# Patient Record
Sex: Male | Born: 1935 | Race: White | Hispanic: No | Marital: Married | Smoking: Never smoker
Health system: Southern US, Community
[De-identification: ages and names within clinical notes are randomized; demographics above are authoritative.]

## PROBLEM LIST (undated history)

## (undated) DIAGNOSIS — K4090 Unilateral inguinal hernia, without obstruction or gangrene, not specified as recurrent: Secondary | ICD-10-CM

## (undated) DIAGNOSIS — E785 Hyperlipidemia, unspecified: Secondary | ICD-10-CM

## (undated) DIAGNOSIS — E119 Type 2 diabetes mellitus without complications: Secondary | ICD-10-CM

## (undated) DIAGNOSIS — M1990 Unspecified osteoarthritis, unspecified site: Secondary | ICD-10-CM

## (undated) DIAGNOSIS — Z9889 Other specified postprocedural states: Secondary | ICD-10-CM

## (undated) DIAGNOSIS — M109 Gout, unspecified: Secondary | ICD-10-CM

## (undated) DIAGNOSIS — Z8613 Personal history of malaria: Secondary | ICD-10-CM

## (undated) DIAGNOSIS — N281 Cyst of kidney, acquired: Secondary | ICD-10-CM

## (undated) DIAGNOSIS — K5790 Diverticulosis of intestine, part unspecified, without perforation or abscess without bleeding: Secondary | ICD-10-CM

## (undated) DIAGNOSIS — I10 Essential (primary) hypertension: Secondary | ICD-10-CM

## (undated) DIAGNOSIS — N400 Enlarged prostate without lower urinary tract symptoms: Secondary | ICD-10-CM

## (undated) DIAGNOSIS — J342 Deviated nasal septum: Secondary | ICD-10-CM

## (undated) DIAGNOSIS — R112 Nausea with vomiting, unspecified: Secondary | ICD-10-CM

## (undated) DIAGNOSIS — N200 Calculus of kidney: Secondary | ICD-10-CM

## (undated) DIAGNOSIS — C801 Malignant (primary) neoplasm, unspecified: Secondary | ICD-10-CM

## (undated) HISTORY — PX: EYE SURGERY: SHX253

## (undated) HISTORY — PX: MOHS SURGERY: SUR867

## (undated) HISTORY — PX: TONSILLECTOMY: SUR1361

## (undated) HISTORY — PX: ELBOW SURGERY: SHX618

## (undated) HISTORY — PX: APPENDECTOMY: SHX54

## (undated) HISTORY — PX: COLONOSCOPY: SHX174

## (undated) HISTORY — PX: HERNIA REPAIR: SHX51

## (undated) HISTORY — PX: CARPAL TUNNEL RELEASE: SHX101

---

## 1986-08-11 HISTORY — PX: TRANSURETHRAL RESECTION OF PROSTATE: SHX73

## 1998-01-23 ENCOUNTER — Other Ambulatory Visit: Admission: RE | Admit: 1998-01-23 | Discharge: 1998-01-23 | Payer: Self-pay | Admitting: *Deleted

## 2000-06-02 ENCOUNTER — Encounter: Admission: RE | Admit: 2000-06-02 | Discharge: 2000-06-02 | Payer: Self-pay | Admitting: *Deleted

## 2000-06-02 ENCOUNTER — Encounter: Payer: Self-pay | Admitting: *Deleted

## 2004-08-11 HISTORY — PX: RETINAL TEAR REPAIR CRYOTHERAPY: SHX5304

## 2004-09-23 ENCOUNTER — Encounter: Admission: RE | Admit: 2004-09-23 | Discharge: 2004-09-23 | Payer: Self-pay | Admitting: Otolaryngology

## 2004-10-10 ENCOUNTER — Ambulatory Visit (HOSPITAL_COMMUNITY): Admission: RE | Admit: 2004-10-10 | Discharge: 2004-10-10 | Payer: Self-pay | Admitting: Gastroenterology

## 2011-04-15 ENCOUNTER — Ambulatory Visit (HOSPITAL_BASED_OUTPATIENT_CLINIC_OR_DEPARTMENT_OTHER)
Admission: RE | Admit: 2011-04-15 | Discharge: 2011-04-15 | Disposition: A | Discharge status: Home / Self Care | Payer: Medicare Other | Source: Ambulatory Visit | Attending: Orthopedic Surgery | Admitting: Orthopedic Surgery

## 2011-04-15 DIAGNOSIS — H409 Unspecified glaucoma: Secondary | ICD-10-CM | POA: Insufficient documentation

## 2011-04-15 DIAGNOSIS — I10 Essential (primary) hypertension: Secondary | ICD-10-CM | POA: Insufficient documentation

## 2011-04-15 DIAGNOSIS — G5620 Lesion of ulnar nerve, unspecified upper limb: Secondary | ICD-10-CM | POA: Insufficient documentation

## 2011-04-15 DIAGNOSIS — G5600 Carpal tunnel syndrome, unspecified upper limb: Secondary | ICD-10-CM | POA: Insufficient documentation

## 2011-04-15 DIAGNOSIS — Z01810 Encounter for preprocedural cardiovascular examination: Secondary | ICD-10-CM | POA: Insufficient documentation

## 2011-04-15 DIAGNOSIS — M129 Arthropathy, unspecified: Secondary | ICD-10-CM | POA: Insufficient documentation

## 2011-04-15 DIAGNOSIS — E7800 Pure hypercholesterolemia, unspecified: Secondary | ICD-10-CM | POA: Insufficient documentation

## 2011-04-15 LAB — POCT I-STAT, CHEM 8
BUN: 18 mg/dL (ref 6–23)
Calcium, Ion: 1.13 mmol/L (ref 1.12–1.32)
Chloride: 102 mEq/L (ref 96–112)
Creatinine, Ser: 0.9 mg/dL (ref 0.50–1.35)
Glucose, Bld: 119 mg/dL — ABNORMAL HIGH (ref 70–99)
HCT: 43 % (ref 39.0–52.0)
Hemoglobin: 14.6 g/dL (ref 13.0–17.0)
Potassium: 3.1 mEq/L — ABNORMAL LOW (ref 3.5–5.1)
Sodium: 143 mEq/L (ref 135–145)
TCO2: 28 mmol/L (ref 0–100)

## 2011-04-24 NOTE — Op Note (Signed)
Erik Price, [name]:  [id number]  MEDICAL RECORD NO.:  [id number]  LOCATION:                                 FACILITY:  PHYSICIAN:  Katy Fitch. Ahley Bulls, M.D.      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  OPERATION: 1. Anterior subcutaneous transposition of right ulnar nerve. 2. Right carpal tunnel release through separate incision.  OPERATING SURGEON:  Katy Fitch. Kayshawn Ozburn, MD  ASSISTANT:  Annye Rusk, PA-C  ANESTHESIA:  General by LMA.  SUPERVISING ANESTHESIOLOGIST:  Achille Rich, MD  INDICATIONS:  Erik Price is a 77 year old gentleman referred through the courtesy of Dr. Rodrigo Ran for evaluation and management of intrinsic atrophy and weakness of the right hand.  Clinical examination revealed a 77 year old gentleman with excellent state of health with a chronic elbow deformity due to ununited medial condylar fracture sustained at age 77.  In his youth he was treated with closed technique without what is considered standard treatment at this time which would be open reduction and internal fixation of the medial epicondyle.  He had a chronic medial elbow deformity placing the nerve at significant traction with positions of elbow flexion.  He did not have a  progressive valgus deformity elbow.  During the past 2 years, he noted progressive weakness of __________ grasp.  He had loss of sensibility in the ring and small fingers.  Dr. Waynard Edwards noted these issues on clinical examination and referred him for upper extremity orthopedic consult.  On exam, he was noted to have profound intrinsic atrophy and findings compatible with McGowan grade 3 ulnar neuropathy.  He was evaluated by Dr. Nada Boozer with detailed electrodiagnostic studies which revealed bilateral carpal tunnel syndrome of a mild to moderate degree and profound left ulnar neuropathy with conduction speed across the right elbow of 30 meters per second.  We had a detailed informed consent during which we advised to proceed with transposition of the ulnar nerve at the elbow like the subcutaneous and a right carpal tunnel release.  He understands that he will need to wait a minimum of 18 months to see the maximum benefit of his transposition.  He should see benefit from the carpal tunnel release within 4 months or sooner.  Preoperatively, questions were invited and answered in detail.  He was interviewed by Dr. Chaney Malling from Anesthesia.  General anesthesia by LMA technique was recommended and accepted.  PROCEDURE:  Erik Price was brought to room 2 of the [facility] and placed in supine position on the operating table. Under Dr. Seward Meth direct supervision, general anesthesia by LMA technique was induced.  The right arm was prepped with Betadine soap and solution and sterilely draped.  A pneumatic tourniquet was applied to the proximal right brachium.  Following routine surgical time-out, we exsanguinated the right arm with an Esmarch bandage and inflated the arterial tourniquet to 220 mmHg. Incisions were planned in the palm and at the medial elbow for the aforementioned procedures.  Attention then directed to the hand where a short incision was fashion in  line of ring finger in the palm.  Subcutaneous tissues were carefully divided revealing the palmar fascia.  This was split in line of its fibers to reveal the common sensory branch of the median nerve and superficial palmar arch.  The common sensory branches were followed to the median nerve proper which was separated from transverse carpal ligament with a Insurance risk surveyor.  After hemostasis was achieved with bipolar forceps, the transverse carpal ligament was released subcutaneously extending into the distal forearm.  This widely opened the carpal canal.  No masses or other predicaments were noted anatomically.  The wound was then repaired with intradermal 3-0 Prolene suture.  Attention was then directed to the medial aspect of the right elbow.  A lengthy 10-cm incision was fashioned due to the marked bony deformity. There was a very substantial triceps muscle with hypertrophy and some encapsulation of the ulnar nerve proximally.  The ulnar nerve was identified at the proximal level of the medial epicondyle and fracture nonunion and was followed distally.  The nerve was noted to be markedly flattened and ischemic at the distal aspect of the epicondylar fragment. We subsequently dissected the ulnar nerve 4 cm into the heads of the flexor carpi ulnaris and meticulously performed in anterior subcutaneous transverse position preserving the articular branch and sacrificing one small branch to the posterior aspect of the epicondyle.  The nerve was transposed to a highly vascular bed of subcutaneous fat. Care was taken to avoid any fracture or bands that would cause secondary entrapment.  The medial brachial intermuscular septum was identified and resected with scissors and cutting cautery.  Once the nerve was placed in its new position, we created a fascial wall by sewing the deep fascia to the periosteum and fascia of the flexor carpi ulnaris.  This created a very satisfactory pathway  for the nerve that was not impeding with full extension and flexion to 140 degrees of elbow movement.  The tourniquet was released.  The bleeding points were electrocauterized with bipolar current under saline followed by repair of the skin with subcutaneous sutures of 3-0 Vicryl and intradermal 3-0 Prolene segmental suture with Steri-Strips.  For aftercare, Erik Price was dressed with a gauze dressing and Tegaderm at the elbow and a standard carpal tunnel dressing with sterile gauze, sterile Webril and a volar plaster splint maintaining the wrist in 10 degrees of dorsiflexion.  For aftercare, he is provided prescriptions for Percocet 5 mg one p.o. q.4-6 h. p.r.n. pain.  He was provided 1 g of Ancef as an IV prophylactic antibiotic.     Katy Fitch Brileigh Sevcik, M.D.     RVS/MEDQ  D:  04/15/2011  T:  04/15/2011  Job:  [id number]  cc:   Loraine Leriche A. Perini, M.D.  Electronically Signed by Erik Price M.D. on 04/24/2011 08:40:50 AM

## 2011-09-04 DIAGNOSIS — L570 Actinic keratosis: Secondary | ICD-10-CM | POA: Diagnosis not present

## 2011-09-04 DIAGNOSIS — Z85828 Personal history of other malignant neoplasm of skin: Secondary | ICD-10-CM | POA: Diagnosis not present

## 2011-09-04 DIAGNOSIS — D485 Neoplasm of uncertain behavior of skin: Secondary | ICD-10-CM | POA: Diagnosis not present

## 2011-09-25 DIAGNOSIS — L821 Other seborrheic keratosis: Secondary | ICD-10-CM | POA: Diagnosis not present

## 2011-09-25 DIAGNOSIS — D235 Other benign neoplasm of skin of trunk: Secondary | ICD-10-CM | POA: Diagnosis not present

## 2011-09-25 DIAGNOSIS — D485 Neoplasm of uncertain behavior of skin: Secondary | ICD-10-CM | POA: Diagnosis not present

## 2011-09-25 DIAGNOSIS — C44519 Basal cell carcinoma of skin of other part of trunk: Secondary | ICD-10-CM | POA: Diagnosis not present

## 2012-01-28 DIAGNOSIS — R7301 Impaired fasting glucose: Secondary | ICD-10-CM | POA: Diagnosis not present

## 2012-01-28 DIAGNOSIS — R809 Proteinuria, unspecified: Secondary | ICD-10-CM | POA: Diagnosis not present

## 2012-01-28 DIAGNOSIS — E785 Hyperlipidemia, unspecified: Secondary | ICD-10-CM | POA: Diagnosis not present

## 2012-01-28 DIAGNOSIS — H35349 Macular cyst, hole, or pseudohole, unspecified eye: Secondary | ICD-10-CM | POA: Diagnosis not present

## 2012-01-28 DIAGNOSIS — H2510 Age-related nuclear cataract, unspecified eye: Secondary | ICD-10-CM | POA: Diagnosis not present

## 2012-01-28 DIAGNOSIS — H5210 Myopia, unspecified eye: Secondary | ICD-10-CM | POA: Diagnosis not present

## 2012-01-28 DIAGNOSIS — I10 Essential (primary) hypertension: Secondary | ICD-10-CM | POA: Diagnosis not present

## 2012-01-28 DIAGNOSIS — Z125 Encounter for screening for malignant neoplasm of prostate: Secondary | ICD-10-CM | POA: Diagnosis not present

## 2012-01-28 DIAGNOSIS — M109 Gout, unspecified: Secondary | ICD-10-CM | POA: Diagnosis not present

## 2012-02-02 DIAGNOSIS — Z125 Encounter for screening for malignant neoplasm of prostate: Secondary | ICD-10-CM | POA: Diagnosis not present

## 2012-02-02 DIAGNOSIS — E119 Type 2 diabetes mellitus without complications: Secondary | ICD-10-CM | POA: Diagnosis not present

## 2012-02-02 DIAGNOSIS — Z79899 Other long term (current) drug therapy: Secondary | ICD-10-CM | POA: Diagnosis not present

## 2012-02-02 DIAGNOSIS — Z1212 Encounter for screening for malignant neoplasm of rectum: Secondary | ICD-10-CM | POA: Diagnosis not present

## 2012-02-02 DIAGNOSIS — Z0000 Encounter for general adult medical examination without abnormal findings: Secondary | ICD-10-CM | POA: Diagnosis not present

## 2012-02-02 DIAGNOSIS — E785 Hyperlipidemia, unspecified: Secondary | ICD-10-CM | POA: Diagnosis not present

## 2012-04-01 DIAGNOSIS — IMO0002 Reserved for concepts with insufficient information to code with codable children: Secondary | ICD-10-CM | POA: Diagnosis not present

## 2012-04-01 DIAGNOSIS — H2510 Age-related nuclear cataract, unspecified eye: Secondary | ICD-10-CM | POA: Diagnosis not present

## 2012-04-08 DIAGNOSIS — H2510 Age-related nuclear cataract, unspecified eye: Secondary | ICD-10-CM | POA: Diagnosis not present

## 2012-04-08 DIAGNOSIS — IMO0002 Reserved for concepts with insufficient information to code with codable children: Secondary | ICD-10-CM | POA: Diagnosis not present

## 2012-04-29 DIAGNOSIS — Z23 Encounter for immunization: Secondary | ICD-10-CM | POA: Diagnosis not present

## 2012-07-26 DIAGNOSIS — R7301 Impaired fasting glucose: Secondary | ICD-10-CM | POA: Diagnosis not present

## 2012-07-26 DIAGNOSIS — E785 Hyperlipidemia, unspecified: Secondary | ICD-10-CM | POA: Diagnosis not present

## 2012-09-29 DIAGNOSIS — D2370 Other benign neoplasm of skin of unspecified lower limb, including hip: Secondary | ICD-10-CM | POA: Diagnosis not present

## 2012-09-29 DIAGNOSIS — Z85828 Personal history of other malignant neoplasm of skin: Secondary | ICD-10-CM | POA: Diagnosis not present

## 2012-09-29 DIAGNOSIS — D485 Neoplasm of uncertain behavior of skin: Secondary | ICD-10-CM | POA: Diagnosis not present

## 2012-09-29 DIAGNOSIS — L570 Actinic keratosis: Secondary | ICD-10-CM | POA: Diagnosis not present

## 2012-09-29 DIAGNOSIS — L821 Other seborrheic keratosis: Secondary | ICD-10-CM | POA: Diagnosis not present

## 2012-09-29 DIAGNOSIS — L820 Inflamed seborrheic keratosis: Secondary | ICD-10-CM | POA: Diagnosis not present

## 2013-03-23 DIAGNOSIS — M109 Gout, unspecified: Secondary | ICD-10-CM | POA: Diagnosis not present

## 2013-03-23 DIAGNOSIS — E1169 Type 2 diabetes mellitus with other specified complication: Secondary | ICD-10-CM | POA: Diagnosis not present

## 2013-03-23 DIAGNOSIS — Z125 Encounter for screening for malignant neoplasm of prostate: Secondary | ICD-10-CM | POA: Diagnosis not present

## 2013-03-23 DIAGNOSIS — E785 Hyperlipidemia, unspecified: Secondary | ICD-10-CM | POA: Diagnosis not present

## 2013-03-31 DIAGNOSIS — E119 Type 2 diabetes mellitus without complications: Secondary | ICD-10-CM | POA: Diagnosis not present

## 2013-03-31 DIAGNOSIS — Z79899 Other long term (current) drug therapy: Secondary | ICD-10-CM | POA: Diagnosis not present

## 2013-03-31 DIAGNOSIS — M109 Gout, unspecified: Secondary | ICD-10-CM | POA: Diagnosis not present

## 2013-03-31 DIAGNOSIS — Z23 Encounter for immunization: Secondary | ICD-10-CM | POA: Diagnosis not present

## 2013-03-31 DIAGNOSIS — Z6827 Body mass index (BMI) 27.0-27.9, adult: Secondary | ICD-10-CM | POA: Diagnosis not present

## 2013-03-31 DIAGNOSIS — Z87898 Personal history of other specified conditions: Secondary | ICD-10-CM | POA: Diagnosis not present

## 2013-03-31 DIAGNOSIS — Z1212 Encounter for screening for malignant neoplasm of rectum: Secondary | ICD-10-CM | POA: Diagnosis not present

## 2013-03-31 DIAGNOSIS — Z125 Encounter for screening for malignant neoplasm of prostate: Secondary | ICD-10-CM | POA: Diagnosis not present

## 2013-03-31 DIAGNOSIS — Z87448 Personal history of other diseases of urinary system: Secondary | ICD-10-CM | POA: Diagnosis not present

## 2013-03-31 DIAGNOSIS — Z1331 Encounter for screening for depression: Secondary | ICD-10-CM | POA: Diagnosis not present

## 2013-03-31 DIAGNOSIS — Z0000 Encounter for general adult medical examination without abnormal findings: Secondary | ICD-10-CM | POA: Diagnosis not present

## 2013-04-20 DIAGNOSIS — Z23 Encounter for immunization: Secondary | ICD-10-CM | POA: Diagnosis not present

## 2013-05-27 DIAGNOSIS — Z961 Presence of intraocular lens: Secondary | ICD-10-CM | POA: Diagnosis not present

## 2013-05-27 DIAGNOSIS — H52209 Unspecified astigmatism, unspecified eye: Secondary | ICD-10-CM | POA: Diagnosis not present

## 2013-08-18 DIAGNOSIS — H905 Unspecified sensorineural hearing loss: Secondary | ICD-10-CM | POA: Diagnosis not present

## 2013-08-18 DIAGNOSIS — H6120 Impacted cerumen, unspecified ear: Secondary | ICD-10-CM | POA: Diagnosis not present

## 2013-08-24 DIAGNOSIS — H905 Unspecified sensorineural hearing loss: Secondary | ICD-10-CM | POA: Diagnosis not present

## 2013-08-31 DIAGNOSIS — Z85828 Personal history of other malignant neoplasm of skin: Secondary | ICD-10-CM | POA: Diagnosis not present

## 2013-08-31 DIAGNOSIS — L570 Actinic keratosis: Secondary | ICD-10-CM | POA: Diagnosis not present

## 2013-08-31 DIAGNOSIS — L821 Other seborrheic keratosis: Secondary | ICD-10-CM | POA: Diagnosis not present

## 2013-08-31 DIAGNOSIS — C44221 Squamous cell carcinoma of skin of unspecified ear and external auricular canal: Secondary | ICD-10-CM | POA: Diagnosis not present

## 2013-08-31 DIAGNOSIS — D485 Neoplasm of uncertain behavior of skin: Secondary | ICD-10-CM | POA: Diagnosis not present

## 2013-09-12 DIAGNOSIS — Z85828 Personal history of other malignant neoplasm of skin: Secondary | ICD-10-CM | POA: Diagnosis not present

## 2013-09-12 DIAGNOSIS — C44221 Squamous cell carcinoma of skin of unspecified ear and external auricular canal: Secondary | ICD-10-CM | POA: Diagnosis not present

## 2013-10-07 DIAGNOSIS — E119 Type 2 diabetes mellitus without complications: Secondary | ICD-10-CM | POA: Diagnosis not present

## 2013-11-15 DIAGNOSIS — R300 Dysuria: Secondary | ICD-10-CM | POA: Diagnosis not present

## 2013-11-15 DIAGNOSIS — R82998 Other abnormal findings in urine: Secondary | ICD-10-CM | POA: Diagnosis not present

## 2013-12-02 DIAGNOSIS — R339 Retention of urine, unspecified: Secondary | ICD-10-CM | POA: Diagnosis not present

## 2013-12-02 DIAGNOSIS — Q619 Cystic kidney disease, unspecified: Secondary | ICD-10-CM | POA: Diagnosis not present

## 2013-12-02 DIAGNOSIS — N200 Calculus of kidney: Secondary | ICD-10-CM | POA: Diagnosis not present

## 2013-12-02 DIAGNOSIS — R3129 Other microscopic hematuria: Secondary | ICD-10-CM | POA: Diagnosis not present

## 2013-12-21 DIAGNOSIS — R3129 Other microscopic hematuria: Secondary | ICD-10-CM | POA: Diagnosis not present

## 2013-12-21 DIAGNOSIS — Q619 Cystic kidney disease, unspecified: Secondary | ICD-10-CM | POA: Diagnosis not present

## 2014-01-24 DIAGNOSIS — R3129 Other microscopic hematuria: Secondary | ICD-10-CM | POA: Diagnosis not present

## 2014-01-24 DIAGNOSIS — Q619 Cystic kidney disease, unspecified: Secondary | ICD-10-CM | POA: Diagnosis not present

## 2014-03-30 DIAGNOSIS — C44211 Basal cell carcinoma of skin of unspecified ear and external auricular canal: Secondary | ICD-10-CM | POA: Diagnosis not present

## 2014-03-30 DIAGNOSIS — D485 Neoplasm of uncertain behavior of skin: Secondary | ICD-10-CM | POA: Diagnosis not present

## 2014-03-30 DIAGNOSIS — Z85828 Personal history of other malignant neoplasm of skin: Secondary | ICD-10-CM | POA: Diagnosis not present

## 2014-03-30 DIAGNOSIS — L821 Other seborrheic keratosis: Secondary | ICD-10-CM | POA: Diagnosis not present

## 2014-03-30 DIAGNOSIS — D1801 Hemangioma of skin and subcutaneous tissue: Secondary | ICD-10-CM | POA: Diagnosis not present

## 2014-03-30 DIAGNOSIS — D239 Other benign neoplasm of skin, unspecified: Secondary | ICD-10-CM | POA: Diagnosis not present

## 2014-04-18 DIAGNOSIS — I10 Essential (primary) hypertension: Secondary | ICD-10-CM | POA: Diagnosis not present

## 2014-04-18 DIAGNOSIS — E119 Type 2 diabetes mellitus without complications: Secondary | ICD-10-CM | POA: Diagnosis not present

## 2014-04-18 DIAGNOSIS — M109 Gout, unspecified: Secondary | ICD-10-CM | POA: Diagnosis not present

## 2014-04-18 DIAGNOSIS — R82998 Other abnormal findings in urine: Secondary | ICD-10-CM | POA: Diagnosis not present

## 2014-04-18 DIAGNOSIS — E785 Hyperlipidemia, unspecified: Secondary | ICD-10-CM | POA: Diagnosis not present

## 2014-04-18 DIAGNOSIS — Z125 Encounter for screening for malignant neoplasm of prostate: Secondary | ICD-10-CM | POA: Diagnosis not present

## 2014-04-28 DIAGNOSIS — Z0000 Encounter for general adult medical examination without abnormal findings: Secondary | ICD-10-CM | POA: Diagnosis not present

## 2014-04-28 DIAGNOSIS — Z1331 Encounter for screening for depression: Secondary | ICD-10-CM | POA: Diagnosis not present

## 2014-04-28 DIAGNOSIS — Z6826 Body mass index (BMI) 26.0-26.9, adult: Secondary | ICD-10-CM | POA: Diagnosis not present

## 2014-04-28 DIAGNOSIS — M109 Gout, unspecified: Secondary | ICD-10-CM | POA: Diagnosis not present

## 2014-04-28 DIAGNOSIS — Z23 Encounter for immunization: Secondary | ICD-10-CM | POA: Diagnosis not present

## 2014-04-28 DIAGNOSIS — E119 Type 2 diabetes mellitus without complications: Secondary | ICD-10-CM | POA: Diagnosis not present

## 2014-04-28 DIAGNOSIS — Z79899 Other long term (current) drug therapy: Secondary | ICD-10-CM | POA: Diagnosis not present

## 2014-04-28 DIAGNOSIS — I10 Essential (primary) hypertension: Secondary | ICD-10-CM | POA: Diagnosis not present

## 2014-04-28 DIAGNOSIS — K5900 Constipation, unspecified: Secondary | ICD-10-CM | POA: Diagnosis not present

## 2014-04-28 DIAGNOSIS — E785 Hyperlipidemia, unspecified: Secondary | ICD-10-CM | POA: Diagnosis not present

## 2014-05-01 DIAGNOSIS — Z1212 Encounter for screening for malignant neoplasm of rectum: Secondary | ICD-10-CM | POA: Diagnosis not present

## 2014-06-02 DIAGNOSIS — I10 Essential (primary) hypertension: Secondary | ICD-10-CM | POA: Diagnosis not present

## 2014-07-12 DIAGNOSIS — H52203 Unspecified astigmatism, bilateral: Secondary | ICD-10-CM | POA: Diagnosis not present

## 2014-07-12 DIAGNOSIS — Z961 Presence of intraocular lens: Secondary | ICD-10-CM | POA: Diagnosis not present

## 2014-07-14 DIAGNOSIS — R300 Dysuria: Secondary | ICD-10-CM | POA: Diagnosis not present

## 2014-08-07 DIAGNOSIS — R300 Dysuria: Secondary | ICD-10-CM | POA: Diagnosis not present

## 2014-08-07 DIAGNOSIS — R8299 Other abnormal findings in urine: Secondary | ICD-10-CM | POA: Diagnosis not present

## 2014-09-28 DIAGNOSIS — D485 Neoplasm of uncertain behavior of skin: Secondary | ICD-10-CM | POA: Diagnosis not present

## 2014-09-28 DIAGNOSIS — L821 Other seborrheic keratosis: Secondary | ICD-10-CM | POA: Diagnosis not present

## 2014-09-28 DIAGNOSIS — C4441 Basal cell carcinoma of skin of scalp and neck: Secondary | ICD-10-CM | POA: Diagnosis not present

## 2014-09-28 DIAGNOSIS — Z85828 Personal history of other malignant neoplasm of skin: Secondary | ICD-10-CM | POA: Diagnosis not present

## 2014-09-28 DIAGNOSIS — D2371 Other benign neoplasm of skin of right lower limb, including hip: Secondary | ICD-10-CM | POA: Diagnosis not present

## 2014-09-28 DIAGNOSIS — L570 Actinic keratosis: Secondary | ICD-10-CM | POA: Diagnosis not present

## 2014-09-28 DIAGNOSIS — D225 Melanocytic nevi of trunk: Secondary | ICD-10-CM | POA: Diagnosis not present

## 2014-09-28 DIAGNOSIS — D2362 Other benign neoplasm of skin of left upper limb, including shoulder: Secondary | ICD-10-CM | POA: Diagnosis not present

## 2014-09-28 DIAGNOSIS — D1801 Hemangioma of skin and subcutaneous tissue: Secondary | ICD-10-CM | POA: Diagnosis not present

## 2014-10-12 DIAGNOSIS — H9041 Sensorineural hearing loss, unilateral, right ear, with unrestricted hearing on the contralateral side: Secondary | ICD-10-CM | POA: Diagnosis not present

## 2014-10-27 DIAGNOSIS — E119 Type 2 diabetes mellitus without complications: Secondary | ICD-10-CM | POA: Diagnosis not present

## 2014-11-11 ENCOUNTER — Emergency Department (HOSPITAL_COMMUNITY): Payer: Medicare Other

## 2014-11-11 ENCOUNTER — Inpatient Hospital Stay (HOSPITAL_COMMUNITY)
Admission: EM | Admit: 2014-11-11 | Discharge: 2014-11-13 | DRG: 698 | Disposition: A | Discharge status: Home / Self Care | Payer: Medicare Other | Attending: Internal Medicine | Admitting: Internal Medicine

## 2014-11-11 ENCOUNTER — Encounter (HOSPITAL_COMMUNITY): Payer: Self-pay | Admitting: Emergency Medicine

## 2014-11-11 DIAGNOSIS — N401 Enlarged prostate with lower urinary tract symptoms: Secondary | ICD-10-CM | POA: Diagnosis present

## 2014-11-11 DIAGNOSIS — K659 Peritonitis, unspecified: Secondary | ICD-10-CM | POA: Diagnosis present

## 2014-11-11 DIAGNOSIS — N281 Cyst of kidney, acquired: Secondary | ICD-10-CM | POA: Insufficient documentation

## 2014-11-11 DIAGNOSIS — N2889 Other specified disorders of kidney and ureter: Secondary | ICD-10-CM | POA: Diagnosis present

## 2014-11-11 DIAGNOSIS — Q6102 Congenital multiple renal cysts: Secondary | ICD-10-CM

## 2014-11-11 DIAGNOSIS — R1030 Lower abdominal pain, unspecified: Secondary | ICD-10-CM

## 2014-11-11 DIAGNOSIS — E119 Type 2 diabetes mellitus without complications: Secondary | ICD-10-CM

## 2014-11-11 DIAGNOSIS — R972 Elevated prostate specific antigen [PSA]: Secondary | ICD-10-CM | POA: Diagnosis not present

## 2014-11-11 DIAGNOSIS — R100 Acute abdomen: Secondary | ICD-10-CM | POA: Diagnosis not present

## 2014-11-11 DIAGNOSIS — Z9079 Acquired absence of other genital organ(s): Secondary | ICD-10-CM | POA: Diagnosis present

## 2014-11-11 DIAGNOSIS — Z7982 Long term (current) use of aspirin: Secondary | ICD-10-CM

## 2014-11-11 DIAGNOSIS — R109 Unspecified abdominal pain: Secondary | ICD-10-CM | POA: Diagnosis present

## 2014-11-11 DIAGNOSIS — R1031 Right lower quadrant pain: Secondary | ICD-10-CM | POA: Diagnosis not present

## 2014-11-11 DIAGNOSIS — Z79899 Other long term (current) drug therapy: Secondary | ICD-10-CM

## 2014-11-11 DIAGNOSIS — E876 Hypokalemia: Secondary | ICD-10-CM | POA: Diagnosis present

## 2014-11-11 DIAGNOSIS — Z8249 Family history of ischemic heart disease and other diseases of the circulatory system: Secondary | ICD-10-CM

## 2014-11-11 DIAGNOSIS — K567 Ileus, unspecified: Secondary | ICD-10-CM | POA: Diagnosis present

## 2014-11-11 DIAGNOSIS — R944 Abnormal results of kidney function studies: Secondary | ICD-10-CM | POA: Diagnosis not present

## 2014-11-11 DIAGNOSIS — E785 Hyperlipidemia, unspecified: Secondary | ICD-10-CM | POA: Diagnosis present

## 2014-11-11 DIAGNOSIS — N179 Acute kidney failure, unspecified: Secondary | ICD-10-CM | POA: Diagnosis present

## 2014-11-11 DIAGNOSIS — N1330 Unspecified hydronephrosis: Secondary | ICD-10-CM | POA: Diagnosis present

## 2014-11-11 DIAGNOSIS — M109 Gout, unspecified: Secondary | ICD-10-CM | POA: Diagnosis present

## 2014-11-11 DIAGNOSIS — R1032 Left lower quadrant pain: Secondary | ICD-10-CM | POA: Diagnosis not present

## 2014-11-11 DIAGNOSIS — I10 Essential (primary) hypertension: Secondary | ICD-10-CM | POA: Diagnosis not present

## 2014-11-11 DIAGNOSIS — D72829 Elevated white blood cell count, unspecified: Secondary | ICD-10-CM | POA: Diagnosis not present

## 2014-11-11 DIAGNOSIS — Z9049 Acquired absence of other specified parts of digestive tract: Secondary | ICD-10-CM | POA: Diagnosis present

## 2014-11-11 DIAGNOSIS — R112 Nausea with vomiting, unspecified: Secondary | ICD-10-CM | POA: Diagnosis not present

## 2014-11-11 DIAGNOSIS — K8020 Calculus of gallbladder without cholecystitis without obstruction: Secondary | ICD-10-CM | POA: Diagnosis not present

## 2014-11-11 HISTORY — DX: Type 2 diabetes mellitus without complications: E11.9

## 2014-11-11 HISTORY — DX: Diverticulosis of intestine, part unspecified, without perforation or abscess without bleeding: K57.90

## 2014-11-11 HISTORY — DX: Hyperlipidemia, unspecified: E78.5

## 2014-11-11 HISTORY — DX: Cyst of kidney, acquired: N28.1

## 2014-11-11 HISTORY — DX: Essential (primary) hypertension: I10

## 2014-11-11 HISTORY — DX: Gout, unspecified: M10.9

## 2014-11-11 HISTORY — DX: Unilateral inguinal hernia, without obstruction or gangrene, not specified as recurrent: K40.90

## 2014-11-11 HISTORY — DX: Benign prostatic hyperplasia without lower urinary tract symptoms: N40.0

## 2014-11-11 LAB — CBC WITH DIFFERENTIAL/PLATELET
BASOS PCT: 0 % (ref 0–1)
Basophils Absolute: 0 10*3/uL (ref 0.0–0.1)
EOS PCT: 0 % (ref 0–5)
Eosinophils Absolute: 0 10*3/uL (ref 0.0–0.7)
HEMATOCRIT: 42.1 % (ref 39.0–52.0)
Hemoglobin: 14.2 g/dL (ref 13.0–17.0)
Lymphocytes Relative: 4 % — ABNORMAL LOW (ref 12–46)
Lymphs Abs: 0.6 10*3/uL — ABNORMAL LOW (ref 0.7–4.0)
MCH: 29.5 pg (ref 26.0–34.0)
MCHC: 33.7 g/dL (ref 30.0–36.0)
MCV: 87.3 fL (ref 78.0–100.0)
MONO ABS: 1.1 10*3/uL — AB (ref 0.1–1.0)
MONOS PCT: 7 % (ref 3–12)
Neutro Abs: 13.7 10*3/uL — ABNORMAL HIGH (ref 1.7–7.7)
Neutrophils Relative %: 89 % — ABNORMAL HIGH (ref 43–77)
Platelets: 149 10*3/uL — ABNORMAL LOW (ref 150–400)
RBC: 4.82 MIL/uL (ref 4.22–5.81)
RDW: 14.7 % (ref 11.5–15.5)
WBC: 15.5 10*3/uL — ABNORMAL HIGH (ref 4.0–10.5)

## 2014-11-11 LAB — COMPREHENSIVE METABOLIC PANEL
ALT: 19 U/L (ref 0–53)
AST: 25 U/L (ref 0–37)
Albumin: 4.2 g/dL (ref 3.5–5.2)
Alkaline Phosphatase: 56 U/L (ref 39–117)
Anion gap: 14 (ref 5–15)
BUN: 21 mg/dL (ref 6–23)
CHLORIDE: 100 mmol/L (ref 96–112)
CO2: 26 mmol/L (ref 19–32)
CREATININE: 1.32 mg/dL (ref 0.50–1.35)
Calcium: 9.1 mg/dL (ref 8.4–10.5)
GFR calc Af Amer: 56 mL/min — ABNORMAL LOW (ref >90)
GFR calc non Af Amer: 49 mL/min — ABNORMAL LOW (ref >90)
Glucose, Bld: 178 mg/dL — ABNORMAL HIGH (ref 70–99)
Potassium: 3.1 mmol/L — ABNORMAL LOW (ref 3.5–5.1)
Sodium: 140 mmol/L (ref 135–145)
TOTAL PROTEIN: 7.5 g/dL (ref 6.0–8.3)
Total Bilirubin: 1.2 mg/dL (ref 0.3–1.2)

## 2014-11-11 LAB — I-STAT CG4 LACTIC ACID, ED: LACTIC ACID, VENOUS: 2.37 mmol/L — AB (ref 0.5–2.0)

## 2014-11-11 LAB — URINALYSIS, ROUTINE W REFLEX MICROSCOPIC
Bilirubin Urine: NEGATIVE
Glucose, UA: NEGATIVE mg/dL
KETONES UR: NEGATIVE mg/dL
Leukocytes, UA: NEGATIVE
Nitrite: NEGATIVE
PROTEIN: NEGATIVE mg/dL
Specific Gravity, Urine: 1.027 (ref 1.005–1.030)
UROBILINOGEN UA: 0.2 mg/dL (ref 0.0–1.0)
pH: 7.5 (ref 5.0–8.0)

## 2014-11-11 LAB — LIPASE, BLOOD: Lipase: 18 U/L (ref 11–59)

## 2014-11-11 LAB — URINE MICROSCOPIC-ADD ON

## 2014-11-11 MED ORDER — FLUTICASONE PROPIONATE 50 MCG/ACT NA SUSP
2.0000 | Freq: Every day | NASAL | Status: DC
  Administered 2014-11-11 – 2014-11-12 (×2): 2 via NASAL
  Filled 2014-11-11: qty 16

## 2014-11-11 MED ORDER — METOPROLOL TARTRATE 50 MG PO TABS
50.0000 mg | ORAL_TABLET | Freq: Two times a day (BID) | ORAL | Status: DC
  Administered 2014-11-11 – 2014-11-13 (×5): 50 mg via ORAL
  Filled 2014-11-11 (×6): qty 1

## 2014-11-11 MED ORDER — SODIUM CHLORIDE 0.9 % IV SOLN
INTRAVENOUS | Status: DC
  Administered 2014-11-11: 23:00:00 via INTRAVENOUS
  Administered 2014-11-11: 125 mL/h via INTRAVENOUS
  Administered 2014-11-12: 1000 mL via INTRAVENOUS
  Administered 2014-11-12 – 2014-11-13 (×3): via INTRAVENOUS

## 2014-11-11 MED ORDER — AMLODIPINE BESYLATE 10 MG PO TABS
10.0000 mg | ORAL_TABLET | Freq: Every morning | ORAL | Status: DC
  Administered 2014-11-11 – 2014-11-13 (×3): 10 mg via ORAL
  Filled 2014-11-11 (×3): qty 1

## 2014-11-11 MED ORDER — CETYLPYRIDINIUM CHLORIDE 0.05 % MT LIQD: 7.0000 mL | Freq: Two times a day (BID) | OROMUCOSAL | Status: DC

## 2014-11-11 MED ORDER — ALLOPURINOL 150 MG HALF TABLET
150.0000 mg | ORAL_TABLET | Freq: Every day | ORAL | Status: DC
  Administered 2014-11-11 – 2014-11-12 (×2): 150 mg via ORAL
  Filled 2014-11-11 (×3): qty 1

## 2014-11-11 MED ORDER — ACETAMINOPHEN 325 MG PO TABS: 650.0000 mg | ORAL_TABLET | Freq: Four times a day (QID) | ORAL | Status: DC | PRN

## 2014-11-11 MED ORDER — OXYCODONE HCL 5 MG PO TABS: 5.0000 mg | ORAL_TABLET | ORAL | Status: DC | PRN

## 2014-11-11 MED ORDER — BISACODYL 10 MG RE SUPP
10.0000 mg | Freq: Every day | RECTAL | Status: DC | PRN
  Administered 2014-11-13: 10 mg via RECTAL
  Filled 2014-11-11: qty 1

## 2014-11-11 MED ORDER — ONDANSETRON HCL 4 MG PO TABS: 4.0000 mg | ORAL_TABLET | Freq: Four times a day (QID) | ORAL | Status: DC | PRN

## 2014-11-11 MED ORDER — SIMVASTATIN 20 MG PO TABS
20.0000 mg | ORAL_TABLET | Freq: Every day | ORAL | Status: DC
Start: 2014-11-11 — End: 2014-11-13
  Administered 2014-11-11 – 2014-11-12 (×2): 20 mg via ORAL
  Filled 2014-11-11 (×3): qty 1

## 2014-11-11 MED ORDER — ONDANSETRON HCL 4 MG/2ML IJ SOLN: 4.0000 mg | Freq: Four times a day (QID) | INTRAMUSCULAR | Status: DC | PRN

## 2014-11-11 MED ORDER — LORATADINE 10 MG PO TABS
10.0000 mg | ORAL_TABLET | Freq: Every day | ORAL | Status: DC
  Administered 2014-11-11 – 2014-11-13 (×3): 10 mg via ORAL
  Filled 2014-11-11 (×3): qty 1

## 2014-11-11 MED ORDER — IOHEXOL 300 MG/ML  SOLN
50.0000 mL | Freq: Once | INTRAMUSCULAR | Status: AC | PRN
  Administered 2014-11-11: 50 mL via ORAL

## 2014-11-11 MED ORDER — SODIUM CHLORIDE 0.9 % IV BOLUS (SEPSIS)
1000.0000 mL | Freq: Once | INTRAVENOUS | Status: AC
  Administered 2014-11-11: 1000 mL via INTRAVENOUS

## 2014-11-11 MED ORDER — SODIUM CHLORIDE 0.9 % IV BOLUS (SEPSIS)
500.0000 mL | Freq: Once | INTRAVENOUS | Status: AC
  Administered 2014-11-11: 500 mL via INTRAVENOUS

## 2014-11-11 MED ORDER — ACETAMINOPHEN 650 MG RE SUPP: 650.0000 mg | Freq: Four times a day (QID) | RECTAL | Status: DC | PRN

## 2014-11-11 MED ORDER — ONDANSETRON HCL 4 MG/2ML IJ SOLN
4.0000 mg | Freq: Once | INTRAMUSCULAR | Status: AC
  Administered 2014-11-11: 4 mg via INTRAVENOUS
  Filled 2014-11-11: qty 2

## 2014-11-11 MED ORDER — OMEGA-3-ACID ETHYL ESTERS 1 G PO CAPS
1.0000 g | ORAL_CAPSULE | Freq: Three times a day (TID) | ORAL | Status: DC
  Administered 2014-11-11 – 2014-11-13 (×6): 1 g via ORAL
  Filled 2014-11-11 (×8): qty 1

## 2014-11-11 MED ORDER — POLYETHYLENE GLYCOL 3350 17 G PO PACK: 17.0000 g | PACK | Freq: Every day | ORAL | Status: DC | PRN

## 2014-11-11 MED ORDER — MORPHINE SULFATE 4 MG/ML IJ SOLN: 4.0000 mg | INTRAMUSCULAR | Status: DC | PRN

## 2014-11-11 MED ORDER — IOHEXOL 300 MG/ML  SOLN
100.0000 mL | Freq: Once | INTRAMUSCULAR | Status: AC | PRN
  Administered 2014-11-11: 100 mL via INTRAVENOUS

## 2014-11-11 MED ORDER — ALUM & MAG HYDROXIDE-SIMETH 200-200-20 MG/5ML PO SUSP: 30.0000 mL | Freq: Four times a day (QID) | ORAL | Status: DC | PRN

## 2014-11-11 MED ORDER — ADULT MULTIVITAMIN W/MINERALS CH
1.0000 | ORAL_TABLET | Freq: Every morning | ORAL | Status: DC
  Administered 2014-11-12 – 2014-11-13 (×2): 1 via ORAL
  Filled 2014-11-11 (×3): qty 1

## 2014-11-11 MED ORDER — CHLORHEXIDINE GLUCONATE 0.12 % MT SOLN
15.0000 mL | Freq: Two times a day (BID) | OROMUCOSAL | Status: DC
  Administered 2014-11-11 – 2014-11-12 (×2): 15 mL via OROMUCOSAL
  Filled 2014-11-11 (×5): qty 15

## 2014-11-11 MED ORDER — MORPHINE SULFATE 4 MG/ML IJ SOLN
4.0000 mg | Freq: Once | INTRAMUSCULAR | Status: AC
  Administered 2014-11-11: 4 mg via INTRAVENOUS
  Filled 2014-11-11: qty 1

## 2014-11-11 MED ORDER — SODIUM CHLORIDE 0.9 % IV SOLN: INTRAVENOUS | Status: DC

## 2014-11-11 NOTE — ED Notes (Signed)
Dr. Maryan Rued notified of lactic acid results. Tim, RN aware.

## 2014-11-11 NOTE — ED Notes (Signed)
I have just given report to Potter, RN on [address]; and will transport shortly.  He remains in no distress and has just been seen by Dr. Rockne Menghini.

## 2014-11-11 NOTE — ED Notes (Signed)
Pt reports midline lower abd pain that started last night. Tried enema this am with no relief. No emesis or diarrhea. LBM yesterday. No dysuria or foul smell to urine. Hx of diverticulosis.

## 2014-11-11 NOTE — Consult Note (Signed)
Urology Consult   Physician requesting consult: Dr. Maryan Rued, ED  Reason for consult: Hemorrhagic cyst  History of Present Illness: Erik Price is a 81 y.o. male with known history of benign renal cystic disease who presented with sudden onset of acute lower abdominal pain and constipation. He initially thought it was GI related and did a fleets enema which did not help. He had a fleeting sensation of dull right flank pain that resolved immediately. He had nausea at home that resolved and 1 episode of wretching. His wife feels that his abdomen is slightly more distended than normal for the last week. He also had some urinary urgency and frequency the day his pain presented.   Dr. Karsten Ro performed a CT in 11/2013 to follow his cysts and at that time the size of the cyst in question was approximately the same as today (6x6cm). No significant stones were noted. He also sees Dr. Karsten Ro for BPH and ?UTI and had a TURP in the past.   Past Medical History  Diagnosis Date  . Hypertension   . Gout   . Diverticulosis   . Diabetes mellitus without complication     Past Surgical History  Procedure Laterality Date  . Hernia repair    . Appendectomy    . Transurethral resection of prostate  1988  . Retinal tear repair cryotherapy  2006  . Elbow surgery Right   . Carpal tunnel release Right     Medications:  Home meds:    Medication List    ASK your doctor about these medications        allopurinol 300 MG tablet  Commonly known as:  ZYLOPRIM  Take 150 mg by mouth at bedtime.     amLODipine 10 MG tablet  Commonly known as:  NORVASC  Take 10 mg by mouth every morning.     aspirin EC 81 MG tablet  Take 81 mg by mouth every morning.     docusate sodium 100 MG capsule  Commonly known as:  COLACE  Take 5,000 mg by mouth at bedtime. 5 CAPSULES DAILY AT BEDTIME     fexofenadine 180 MG tablet  Commonly known as:  ALLEGRA  Take 90 mg by mouth every morning.     Fish Oil 1000 MG  Caps  Take 1,000 mg by mouth 3 (three) times daily.     fluticasone 50 MCG/ACT nasal spray  Commonly known as:  FLONASE  Place 2 sprays into both nostrils at bedtime.     hydrochlorothiazide 25 MG tablet  Commonly known as:  HYDRODIURIL  Take 25 mg by mouth every morning.     lisinopril 20 MG tablet  Commonly known as:  PRINIVIL,ZESTRIL  Take 20 mg by mouth every morning.     Methylcellulose (Laxative) 500 MG Tabs  Take 1 tablet by mouth daily at 12 noon.     metoprolol 50 MG tablet  Commonly known as:  LOPRESSOR  Take 50 mg by mouth 2 (two) times daily.     multivitamin with minerals Tabs tablet  Take 1 tablet by mouth every morning.     potassium chloride SA 20 MEQ tablet  Commonly known as:  K-DUR,KLOR-CON  Take 20 mEq by mouth 3 (three) times daily.     simvastatin 40 MG tablet  Commonly known as:  ZOCOR  Take 20 mg by mouth at bedtime.     VIAGRA 100 MG tablet  Generic drug:  sildenafil  Take 100 mg by mouth as needed  for erectile dysfunction.        Scheduled Meds: Continuous Infusions: PRN Meds:.  Allergies: No Known Allergies  History reviewed. No pertinent family history.  Social History:  reports that he has never smoked. He does not have any smokeless tobacco history on file. He reports that he drinks alcohol. He reports that he does not use illicit drugs.  ROS: A complete review of systems was performed.  All systems are negative except for pertinent findings as noted.  Physical Exam:  Vital signs in last 24 hours: Temp:  [98.1 F (36.7 C)] 98.1 F (36.7 C) (04/02 0905) Pulse Rate:  [64-66] 66 (04/02 1028) Resp:  [16] 16 (04/02 1028) BP: (159-160)/(77) 159/77 mmHg (04/02 1028) SpO2:  [94 %-95 %] 95 % (04/02 1028) Constitutional:  Alert and oriented, No acute distress Cardiovascular: Regular rate and rhythm, No JVD Respiratory: Normal respiratory effort, Lungs clear bilaterally GI: Abdomen is soft, mildly tender and mildly distended, no  abdominal masses Genitourinary: No CVAT.  Lymphatic: No lymphadenopathy Neurologic: Grossly intact, no focal deficits Psychiatric: Normal mood and affect  Laboratory Data:   Recent Labs  11/11/14 0917  WBC 15.5*  HGB 14.2  HCT 42.1  PLT 149*     Recent Labs  11/11/14 0917  NA 140  K 3.1*  CL 100  GLUCOSE 178*  BUN 21  CALCIUM 9.1  CREATININE 1.32   Urinalysis    Component Value Date/Time   COLORURINE YELLOW 11/11/2014 1056   APPEARANCEUR CLEAR 11/11/2014 1056   LABSPEC 1.027 11/11/2014 1056   PHURINE 7.5 11/11/2014 1056   GLUCOSEU NEGATIVE 11/11/2014 1056   HGBUR TRACE* 11/11/2014 [employer] 11/11/2014 1056   [place] 11/11/2014 1056   PROTEINUR NEGATIVE 11/11/2014 1056   UROBILINOGEN 0.2 11/11/2014 1056   NITRITE NEGATIVE 11/11/2014 1056   LEUKOCYTESUR NEGATIVE 11/11/2014 1056     No results found for this or any previous visit (from the past 240 hour(s)).  Renal Function:  Recent Labs  11/11/14 0917  CREATININE 1.32   CrCl cannot be calculated (Unknown ideal weight.).  Radiologic Imaging: Ct Abdomen Pelvis W Contrast  11/11/2014   CLINICAL DATA:  Mid lower abdominal pain. Pain started yesterday. Elevated white count.  EXAM: CT ABDOMEN AND PELVIS WITH CONTRAST  TECHNIQUE: Multidetector CT imaging of the abdomen and pelvis was performed using the standard protocol following bolus administration of intravenous contrast.  CONTRAST:  33mL OMNIPAQUE IOHEXOL 300 MG/ML SOLN, 181mL OMNIPAQUE IOHEXOL 300 MG/ML SOLN  COMPARISON:  08/14/2005  FINDINGS: Evidence for mild scarring at the lung bases. Negative for free intraperitoneal air.  There is a large dense lesion centered in the right renal hilum that measures 7.1 x 7.5 x 9.1 cm. A dense structure was present in this area on the previous examination measuring 3.3 x 2.7 cm on 08/14/2005. There is new moderate right hydronephrosis with extensive fluid around the right kidney extending into  the right abdominal mesentery and right lower quadrant. This mesenteric and perinephric fluid is high density and concerning for blood products. There is a small amount of fluid in the left lower quadrant.  There is a 3 mm calcification in the right kidney upper pole point representing a nonobstructive stone. There are multiple exophytic structures involving the right kidney. Many of these are low density and compatible with cysts but one lesion is hyperdense on sequence 2, image 28 measuring 1.3 cm.  Normal appearance of the adrenal glands. Multiple left renal cysts without hydronephrosis. No  acute abnormality to the prostate or urinary bladder.  No acute abnormality in the liver, gallbladder or spleen. The portal venous system is patent. Normal appearance of the pancreas. Duodenal diverticulum in the third portion. Normal appearance of stomach with a small hiatal hernia. Abdominal aorta is tortuous without aneurysm.  Left inguinal hernia containing fat and possibly a small amount of fluid. No acute abnormality in the small or large bowel. 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  IMPRESSION: There has been marked enlargement of a lesion in the right renal sinus. This lesion now measures up to 9 cm. Based on the previous imaging, this could represent large hemorrhagic cyst. However, a neoplastic process cannot be excluded on these images. Recommend follow-up characterization of this lesion with an MRI (with and without contrast).  Moderate right hydronephrosis with extensive high-density fluid around the kidney that extends into the right abdominal mesentery and right lower quadrant. The high density fluid is compatible with blood products. The blood products could be originating from the large renal lesion. The hydronephrosis is likely due to extrinsic compression from the large renal lesion.  Multiple bilateral renal cysts. Most of the cysts are low density but there is an indeterminate 1.3  cm lesion in the right kidney upper pole. This could also be further evaluated with an MRI.  These results were called by telephone at the time of interpretation on 11/11/2014 at 11:23 am to Dr. Blanchie Dessert , who verbally acknowledged these results.   Electronically Signed   By: Markus Daft M.D.   On: 11/11/2014 11:26   I independently reviewed the above imaging studies.  CT non-contrast in 11/2013 at [facility] demonstrates that this lesion was essentially the same size and character (6x6cm with HU of ~100) but less peri-nephric inflammation and no intraperitoneal fluid. This image was not loaded in PACS.  Impression/Recommendation 60M with abdominal pain and distention in the setting of a known right renal cortical cyst that enlarged from 6x6 to 7x7 cm over the last year. He now has intraperitoneal fluid that is likely causing partial ileus and contributing to his abdominal symptoms. It is unclear if this cyst is in fact hemorrhagic, why the fluid would be tracking from the retroperitoneum into his peritoneal cavity, as typically these bleeds are contained in the retroperitoneum.  1. Renal cysts - dominant cyst is likely hemorrhagic but essentially stable in size from 1 year ago with similar density. There is fluid in there peritoneal space, which would be unusual for a retroperitoneal cyst to contribute to fluid in the peritoneum. It is also unlikely that the interval increase in size of 1x1 cm over the course of 1 year would cause any flank pain and it does not appear that there is significant urinary obstruction. It is possible that this cyst is a red herring and that the true cause of his abdominal pain and distention is in fact GI related.   Will consider MRI prior to discharge to further evaluate this lesion as the presentation and symptomatology does not clearly point to the cyst.   2. LUTS - transient increase in frequency/urgency which occurred along with abdominal pain & constipation,  now resolved. No acute intervention.  Seen and examined with Dr. Gaynelle Arabian who agrees with this plan.

## 2014-11-11 NOTE — ED Provider Notes (Signed)
CSN: [id number]     Arrival date & time 11/11/14  0856 History   First MD Initiated Contact with Patient 11/11/14 [phone]     Chief Complaint  Patient presents with  . Abdominal Pain     (Consider location/radiation/quality/duration/timing/severity/associated sxs/prior Treatment) Patient is a 81 y.o. male presenting with abdominal pain. The history is provided by the patient.  Abdominal Pain Pain location:  LLQ and RLQ Pain quality: aching, gnawing, sharp and throbbing   Pain radiates to:  Does not radiate Pain severity:  Moderate Onset quality:  Gradual Duration:  8 hours Timing:  Constant Progression:  Worsening Chronicity:  New Context: awakening from sleep   Relieved by:  Nothing Worsened by:  Movement and palpation Ineffective treatments: enema. Associated symptoms: anorexia and nausea   Associated symptoms: no chest pain, no chills, no constipation, no cough, no diarrhea, no dysuria, no fever and no vomiting   Risk factors: being elderly   Risk factors: no alcohol abuse and not obese     Past Medical History  Diagnosis Date  . Hypertension   . Gout   . Diverticulosis   . Diabetes mellitus without complication    Past Surgical History  Procedure Laterality Date  . Hernia repair    . Appendectomy    . Transurethral resection of prostate  1988  . Retinal tear repair cryotherapy  2006  . Elbow surgery Right   . Carpal tunnel release Right    History reviewed. No pertinent family history. History  Substance Use Topics  . Smoking status: Never Smoker   . Smokeless tobacco: Not on file  . Alcohol Use: Yes     Comment: occasional    Review of Systems  Constitutional: Negative for fever and chills.  Respiratory: Negative for cough.   Cardiovascular: Negative for chest pain.  Gastrointestinal: Positive for nausea, abdominal pain and anorexia. Negative for vomiting, diarrhea and constipation.  Genitourinary: Negative for dysuria.  All other systems reviewed and  are negative.     Allergies  Review of patient's allergies indicates no known allergies.  Home Medications   Prior to Admission medications   Not on File   BP 160/77 mmHg  Pulse 64  Temp(Src) 98.1 F (36.7 C) (Oral)  Resp 16  SpO2 94% Physical Exam  Constitutional: He is oriented to person, place, and time. He appears well-developed and well-nourished. No distress.  HENT:  Head: Normocephalic and atraumatic.  Mouth/Throat: Oropharynx is clear and moist.  Eyes: Conjunctivae and EOM are normal. Pupils are equal, round, and reactive to light.  Neck: Normal range of motion. Neck supple.  Cardiovascular: Normal rate, regular rhythm and intact distal pulses.   No murmur heard. Pulmonary/Chest: Effort normal and breath sounds normal. No respiratory distress. He has no wheezes. He has no rales.  Abdominal: Soft. Normal appearance. He exhibits no distension. There is tenderness in the right lower quadrant, suprapubic area and left lower quadrant. There is rebound and guarding.  Mild tenderness in the upper quadrants  Musculoskeletal: Normal range of motion. He exhibits no edema or tenderness.  Neurological: He is alert and oriented to person, place, and time.  Skin: Skin is warm and dry. No rash noted. No erythema.  Psychiatric: He has a normal mood and affect. His behavior is normal.  Nursing note and vitals reviewed.   ED Course  Procedures (including critical care time) Labs Review Labs Reviewed  CBC WITH DIFFERENTIAL/PLATELET - Abnormal; Notable for the following:    WBC 15.5 (*)  Platelets 149 (*)    Neutrophils Relative % 89 (*)    Neutro Abs 13.7 (*)    Lymphocytes Relative 4 (*)    Lymphs Abs 0.6 (*)    Monocytes Absolute 1.1 (*)    All other components within normal limits  COMPREHENSIVE METABOLIC PANEL - Abnormal; Notable for the following:    Potassium 3.1 (*)    Glucose, Bld 178 (*)    GFR calc non Af Amer 49 (*)    GFR calc Af Amer 56 (*)    All other  components within normal limits  I-STAT CG4 LACTIC ACID, ED - Abnormal; Notable for the following:    Lactic Acid, Venous 2.37 (*)    All other components within normal limits  LIPASE, BLOOD  URINALYSIS, ROUTINE W REFLEX MICROSCOPIC    Imaging Review Ct Abdomen Pelvis W Contrast  11/11/2014   CLINICAL DATA:  Mid lower abdominal pain. Pain started yesterday. Elevated white count.  EXAM: CT ABDOMEN AND PELVIS WITH CONTRAST  TECHNIQUE: Multidetector CT imaging of the abdomen and pelvis was performed using the standard protocol following bolus administration of intravenous contrast.  CONTRAST:  31mL OMNIPAQUE IOHEXOL 300 MG/ML SOLN, 174mL OMNIPAQUE IOHEXOL 300 MG/ML SOLN  COMPARISON:  08/14/2005  FINDINGS: Evidence for mild scarring at the lung bases. Negative for free intraperitoneal air.  There is a large dense lesion centered in the right renal hilum that measures 7.1 x 7.5 x 9.1 cm. A dense structure was present in this area on the previous examination measuring 3.3 x 2.7 cm on 08/14/2005. There is new moderate right hydronephrosis with extensive fluid around the right kidney extending into the right abdominal mesentery and right lower quadrant. This mesenteric and perinephric fluid is high density and concerning for blood products. There is a small amount of fluid in the left lower quadrant.  There is a 3 mm calcification in the right kidney upper pole point representing a nonobstructive stone. There are multiple exophytic structures involving the right kidney. Many of these are low density and compatible with cysts but one lesion is hyperdense on sequence 2, image 28 measuring 1.3 cm.  Normal appearance of the adrenal glands. Multiple left renal cysts without hydronephrosis. No acute abnormality to the prostate or urinary bladder.  No acute abnormality in the liver, gallbladder or spleen. The portal venous system is patent. Normal appearance of the pancreas. Duodenal diverticulum in the third portion.  Normal appearance of stomach with a small hiatal hernia. Abdominal aorta is tortuous without aneurysm.  Left inguinal hernia containing fat and possibly a small amount of fluid. No acute abnormality in the small or large bowel. 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  IMPRESSION: There has been marked enlargement of a lesion in the right renal sinus. This lesion now measures up to 9 cm. Based on the previous imaging, this could represent large hemorrhagic cyst. However, a neoplastic process cannot be excluded on these images. Recommend follow-up characterization of this lesion with an MRI (with and without contrast).  Moderate right hydronephrosis with extensive high-density fluid around the kidney that extends into the right abdominal mesentery and right lower quadrant. The high density fluid is compatible with blood products. The blood products could be originating from the large renal lesion. The hydronephrosis is likely due to extrinsic compression from the large renal lesion.  Multiple bilateral renal cysts. Most of the cysts are low density but there is an indeterminate 1.3 cm lesion in the right kidney  upper pole. This could also be further evaluated with an MRI.  These results were called by telephone at the time of interpretation on 11/11/2014 at 11:23 am to Dr. Blanchie Dessert , who verbally acknowledged these results.   Electronically Signed   By: Markus Daft M.D.   On: 11/11/2014 11:26     EKG Interpretation None      MDM   Final diagnoses:  Abdominal pain  Peritonitis    Patient with gradual onset of lower abdominal pain that started overnight. He states yesterday he felt tired with a decreased appetite but pain did not start until he went to bed. Patient has significant lower quadrant tenderness with guarding and rebound. He is status post appendectomy but concern for diverticulitis with a perforation. Low suspicion for cholecystitis or pancreatitis. He is  hemodynamically stable at this time and takes no anticoagulation.  Patient has a leukocytosis of 15,000 today as well as a lactate of 2.37. He was given IV fluids, pain and nausea medication. CMP, lipase and CT of the abdomen and pelvis with contrast pending.  11:29 AM CT shows marked enlargement of the lesion in the right renal sinus which they think is most likely a large hemorrhagic cyst which is causing moderate right hydronephrosis with extensive high-density fluid around the kidney that extends into the abdominal mesentery which they feel is blood. We will discuss findings with urology for further recommendations.  1:15 PM Urology evaluated the patient and unclear if this is truly the cause of his peritonitis. They will get an MRI for further evaluation and patient will be admitted to medicine.  Blanchie Dessert, MD 11/11/14 1316

## 2014-11-11 NOTE — H&P (Signed)
History and Physical:    Erik Price   LSL:[id number] DOB: [birth year] DOA: 11/11/2014  Referring physician: Dr. Maryan Rued PCP: Jerlyn Ly, MD   Chief Complaint: Lower abdominal pain  History of Present Illness:   Erik Price is an 81 y.o. male with a PMH of benign renal cystic disease and BPH s/p TURP, followed by Dr. Karsten Ro, who presented to the ER today with the sudden onset of acute lower abdominal pain (described as a sharp/ache rated 6-7/10) at approximately 1 AM. He initially thought it was from constipation and did a fleets enema (no results) which did not help. He had a fleeting sensation of dull right flank pain that resolved immediately. He had nausea at home that resolved and 1 episode of wretching but no frank vomiting. His wife feels that his abdomen is slightly more distended than normal over the last week.  The patient tells me that the pain was similar to an episode of diverticulitis, which he has had in the past.  Dr. Karsten Ro performed a CT in 04/2014 to follow his cysts and at that time the size of the cyst in question was stable. No significant stones were noted. CT scan done on admission shows multiple bilateral renal cysts, a 9 cm lesion in the right renal sinus possibly consistent with a large hemorrhagic cyst however a neoplastic process could not be excluded, moderate right hydronephrosis with extensive high density fluid around the kidney that extends into the right abdominal mesentery and right lower quadrant. Urology has seen the patient in consultation and an MRI has been requested but is pending at this time. Patient is currently comfortable with some residual lower abdominal soreness. No specific aggravating factors, but pain medications have made the pain better.  ROS:   Constitutional: No fever, + chills when drinking the contrast earlier;  Appetite normal until this am.; No weight loss, no weight gain, + fatigue.  HEENT: No blurry vision, no  diplopia, no pharyngitis, no dysphagia CV: No chest pain, no palpitations, no PND, no orthopnea, no edema.  Resp: No SOB, no cough, no pleuritic pain. GI: + nausea, no vomiting, no diarrhea, no melena, no hematochezia, + constipation, + abdominal pain.  GU: No dysuria, no hematuria, no frequency, no urgency. MSK: no myalgias, no arthralgias.  Neuro:  No headache, no focal neurological deficits, no history of seizures.  Psych: No depression, no anxiety.  Endo: No heat intolerance, no cold intolerance, no polyuria, no polydipsia  Skin: No rashes, no skin lesions.  Heme: No easy bruising.  Travel history: No recent travel.   Past Medical History:   Past Medical History  Diagnosis Date  . Hypertension   . Gout   . Diverticulosis   . Diabetes mellitus without complication     Diet controlled, last A1c 5.9%  . Hyperlipidemia   . Bilateral renal cysts   . Left inguinal hernia   . Hiatal hernia   . BPH (benign prostatic hyperplasia)     Past Surgical History:   Past Surgical History  Procedure Laterality Date  . Hernia repair    . Appendectomy    . Transurethral resection of prostate  1988  . Retinal tear repair cryotherapy  2006  . Elbow surgery Right   . Carpal tunnel release Right     Social History:   History   Social History  . Marital Status: Married    Spouse Name: N/A  . Number of Children: 2  . Years of Education:  N/A   Occupational History  . Social research officer, government    Social History Main Topics  . Smoking status: Never Smoker   . Smokeless tobacco: Not on file  . Alcohol Use: 0.0 oz/week    0 Standard drinks or equivalent per week     Comment: 1 beer or a glass of week per week  . Drug Use: No  . Sexual Activity: Not on file   Other Topics Concern  . Not on file   Social History Narrative   Married.  Lives with his wife in a retirement community.  Ambulates without assistance.    Family history:   Family History  Problem Relation Age of Onset  . Heart  attack Father     Died at age 76  . Ulcers Mother   . Rheum arthritis Mother     Allergies   Review of patient's allergies indicates no known allergies.  Current Medications:   Prior to Admission medications   Medication Sig Start Date End Date Taking? Authorizing Provider  allopurinol (ZYLOPRIM) 300 MG tablet Take 150 mg by mouth at bedtime.   Yes Historical Provider, MD  amLODipine (NORVASC) 10 MG tablet Take 10 mg by mouth every morning.   Yes Historical Provider, MD  aspirin EC 81 MG tablet Take 81 mg by mouth every morning.   Yes Historical Provider, MD  docusate sodium (COLACE) 100 MG capsule Take 5,000 mg by mouth at bedtime. 5 CAPSULES DAILY AT BEDTIME   Yes Historical Provider, MD  fexofenadine (ALLEGRA) 180 MG tablet Take 90 mg by mouth every morning.   Yes Historical Provider, MD  fluticasone (FLONASE) 50 MCG/ACT nasal spray Place 2 sprays into both nostrils at bedtime.   Yes Historical Provider, MD  hydrochlorothiazide (HYDRODIURIL) 25 MG tablet Take 25 mg by mouth every morning.   Yes Historical Provider, MD  lisinopril (PRINIVIL,ZESTRIL) 20 MG tablet Take 20 mg by mouth every morning.   Yes Historical Provider, MD  Methylcellulose, Laxative, 500 MG TABS Take 1 tablet by mouth daily at 12 noon.   Yes Historical Provider, MD  metoprolol (LOPRESSOR) 50 MG tablet Take 50 mg by mouth 2 (two) times daily.   Yes Historical Provider, MD  Multiple Vitamin (MULTIVITAMIN WITH MINERALS) TABS tablet Take 1 tablet by mouth every morning.   Yes Historical Provider, MD  Omega-3 Fatty Acids (FISH OIL) 1000 MG CAPS Take 1,000 mg by mouth 3 (three) times daily.   Yes Historical Provider, MD  potassium chloride SA (K-DUR,KLOR-CON) 20 MEQ tablet Take 20 mEq by mouth 3 (three) times daily.   Yes Historical Provider, MD  simvastatin (ZOCOR) 40 MG tablet Take 20 mg by mouth at bedtime.   Yes Historical Provider, MD  VIAGRA 100 MG tablet Take 100 mg by mouth as needed for erectile dysfunction.   09/29/14  Yes Historical Provider, MD    Physical Exam:   Filed Vitals:   11/11/14 0905 11/11/14 1028 11/11/14 1415  BP: 160/77 159/77 157/70  Pulse: 64 66 82  Temp: 98.1 F (36.7 C)    TempSrc: Oral    Resp: 16 16 16   SpO2: 94% 95% 94%     Physical Exam: Blood pressure 157/70, pulse 82, temperature 98.1 F (36.7 C), temperature source Oral, resp. rate 16, SpO2 94 %. Gen: No acute distress. Nontoxic appearing. Head: Normocephalic, atraumatic. Eyes: PERRL, EOMI, sclerae nonicteric. Mouth: Oropharynx with dry mucous membranes and some posterior pharyngeal erythema. Neck: Supple, no thyromegaly, no lymphadenopathy, no jugular venous distention. Chest:  Lungs clear to auscultation bilaterally. CV: Heart sounds are regular with a soft grade 2 systolic ejection murmur left upper sternal border. Abdomen: Soft, mildly tender in the lower abdomen, mildly distended with normal active bowel sounds. Extremities: Extremities are without clubbing, edema, or cyanosis. Skin: Warm and dry. Neuro: Alert and oriented times 3; cranial nerves II through XII grossly intact. Psych: Mood and affect normal.   Data Review:    Labs: Basic Metabolic Panel:  Recent Labs Lab 11/11/14 0917  NA 140  K 3.1*  CL 100  CO2 26  GLUCOSE 178*  BUN 21  CREATININE 1.32  CALCIUM 9.1   Liver Function Tests:  Recent Labs Lab 11/11/14 0917  AST 25  ALT 19  ALKPHOS 56  BILITOT 1.2  PROT 7.5  ALBUMIN 4.2    Recent Labs Lab 11/11/14 0917  LIPASE 18   CBC:  Recent Labs Lab 11/11/14 0917  WBC 15.5*  NEUTROABS 13.7*  HGB 14.2  HCT 42.1  MCV 87.3  PLT 149*   Radiographic Studies: Ct Abdomen Pelvis W Contrast  11/11/2014   CLINICAL DATA:  Mid lower abdominal pain. Pain started yesterday. Elevated white count.  EXAM: CT ABDOMEN AND PELVIS WITH CONTRAST  TECHNIQUE: Multidetector CT imaging of the abdomen and pelvis was performed using the standard protocol following bolus administration of  intravenous contrast.  CONTRAST:  24mL OMNIPAQUE IOHEXOL 300 MG/ML SOLN, 153mL OMNIPAQUE IOHEXOL 300 MG/ML SOLN  COMPARISON:  08/14/2005  FINDINGS: Evidence for mild scarring at the lung bases. Negative for free intraperitoneal air.  There is a large dense lesion centered in the right renal hilum that measures 7.1 x 7.5 x 9.1 cm. A dense structure was present in this area on the previous examination measuring 3.3 x 2.7 cm on 08/14/2005. There is new moderate right hydronephrosis with extensive fluid around the right kidney extending into the right abdominal mesentery and right lower quadrant. This mesenteric and perinephric fluid is high density and concerning for blood products. There is a small amount of fluid in the left lower quadrant.  There is a 3 mm calcification in the right kidney upper pole point representing a nonobstructive stone. There are multiple exophytic structures involving the right kidney. Many of these are low density and compatible with cysts but one lesion is hyperdense on sequence 2, image 28 measuring 1.3 cm.  Normal appearance of the adrenal glands. Multiple left renal cysts without hydronephrosis. No acute abnormality to the prostate or urinary bladder.  No acute abnormality in the liver, gallbladder or spleen. The portal venous system is patent. Normal appearance of the pancreas. Duodenal diverticulum in the third portion. Normal appearance of stomach with a small hiatal hernia. Abdominal aorta is tortuous without aneurysm.  Left inguinal hernia containing fat and possibly a small amount of fluid. No acute abnormality in the small or large bowel. 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  IMPRESSION: There has been marked enlargement of a lesion in the right renal sinus. This lesion now measures up to 9 cm. Based on the previous imaging, this could represent large hemorrhagic cyst. However, a neoplastic process cannot be excluded on these images. Recommend follow-up  characterization of this lesion with an MRI (with and without contrast).  Moderate right hydronephrosis with extensive high-density fluid around the kidney that extends into the right abdominal mesentery and right lower quadrant. The high density fluid is compatible with blood products. The blood products could be originating from the large renal lesion.  The hydronephrosis is likely due to extrinsic compression from the large renal lesion.  Multiple bilateral renal cysts. Most of the cysts are low density but there is an indeterminate 1.3 cm lesion in the right kidney upper pole. This could also be further evaluated with an MRI.  These results were called by telephone at the time of interpretation on 11/11/2014 at 11:23 am to Dr. Blanchie Dessert , who verbally acknowledged these results.   Electronically Signed   By: Markus Daft M.D.   On: 11/11/2014 11:26   *I have personally reviewed the images above*    Assessment/Plan:   Principal Problem:   Abdominal pain with right hydronephrosis - CT scan shows possible ruptured hemorrhagic cyst versus neoplastic growth and right hydronephrosis. - MRI ordered to better characterize. - Urology following. - Hold aspirin.  Active Problems:   Hypertension - Continue Norvasc and metoprolol. Hold HCTZ/lisinopril.    Gout - Continue allopurinol.    Diabetes mellitus without complication - Diet controlled.    Bilateral renal cysts - Urology following.    DVT prophylaxis  Code Status: Full.  Has an advanced directive, wife is POA. Family Communication: Liborio Saccente (wife) [phone].   Disposition Plan: Home when stable.  Time spent: 65 minutes.  RAMA,CHRISTINA [facility] Pager [phone] Cell: [phone]   If 7PM-7AM, please contact night-coverage [email or web address] Password [facility] 11/11/2014, 2:24 PM

## 2014-11-12 ENCOUNTER — Inpatient Hospital Stay (HOSPITAL_COMMUNITY): Payer: Medicare Other

## 2014-11-12 LAB — CBC
HCT: 40.1 % (ref 39.0–52.0)
Hemoglobin: 13.1 g/dL (ref 13.0–17.0)
MCH: 28.7 pg (ref 26.0–34.0)
MCHC: 32.7 g/dL (ref 30.0–36.0)
MCV: 87.9 fL (ref 78.0–100.0)
Platelets: 159 10*3/uL (ref 150–400)
RBC: 4.56 MIL/uL (ref 4.22–5.81)
RDW: 15 % (ref 11.5–15.5)
WBC: 13.3 10*3/uL — ABNORMAL HIGH (ref 4.0–10.5)

## 2014-11-12 LAB — BASIC METABOLIC PANEL
Anion gap: 9 (ref 5–15)
BUN: 25 mg/dL — ABNORMAL HIGH (ref 6–23)
CALCIUM: 8.7 mg/dL (ref 8.4–10.5)
CHLORIDE: 104 mmol/L (ref 96–112)
CO2: 28 mmol/L (ref 19–32)
CREATININE: 1.39 mg/dL — AB (ref 0.50–1.35)
GFR calc Af Amer: 53 mL/min — ABNORMAL LOW (ref >90)
GFR calc non Af Amer: 46 mL/min — ABNORMAL LOW (ref >90)
Glucose, Bld: 133 mg/dL — ABNORMAL HIGH (ref 70–99)
Potassium: 3.1 mmol/L — ABNORMAL LOW (ref 3.5–5.1)
Sodium: 141 mmol/L (ref 135–145)

## 2014-11-12 MED ORDER — GADOBENATE DIMEGLUMINE 529 MG/ML IV SOLN
18.0000 mL | Freq: Once | INTRAVENOUS | Status: AC | PRN
  Administered 2014-11-12: 18 mL via INTRAVENOUS

## 2014-11-12 MED ORDER — POTASSIUM CHLORIDE 10 MEQ/100ML IV SOLN
10.0000 meq | INTRAVENOUS | Status: AC
  Administered 2014-11-12 (×5): 10 meq via INTRAVENOUS
  Filled 2014-11-12 (×5): qty 100

## 2014-11-12 MED ORDER — PIPERACILLIN-TAZOBACTAM 3.375 G IVPB
3.3750 g | Freq: Three times a day (TID) | INTRAVENOUS | Status: DC
  Administered 2014-11-12 – 2014-11-13 (×4): 3.375 g via INTRAVENOUS
  Filled 2014-11-12 (×5): qty 50

## 2014-11-12 NOTE — Plan of Care (Signed)
Problem: Phase I Progression Outcomes Goal: Hemodynamically stable Outcome: Not Met (add Reason) Temp 100.4 last night.  Hypertension treated with pt's home med regimen.

## 2014-11-12 NOTE — Progress Notes (Signed)
Subjective:  Erik Price is a 81 y.o. male with known history of benign renal cystic disease who presented with sudden onset of acute lower abdominal pain and constipation. He initially thought it was GI related and did a fleets enema which did not help. He had a fleeting sensation of dull right flank pain that resolved immediately. He had nausea at home that resolved and 1 episode of wretching. His wife feels that his abdomen is slightly more distended than normal for the last week. He also had some urinary urgency and frequency the day his pain presented.   Dr. Karsten Ro performed a CT in 11/2013 to follow his cysts and at that time the size of the cyst in question was approximately the same as today (6x6cm). No significant stones were noted. He also sees Dr. Karsten Ro for BPH and ?UTI and had a TURP in the past.   Interval: Feeling well. Pain has resolved, no complaints. Minimal flatus, no BM, no nausea.  Objective: Vital signs in last 24 hours: Temp:  [98.1 F (36.7 C)-100.4 F (38 C)] 98.7 F (37.1 C) (04/03 0556) Pulse Rate:  [15-82] 66 (04/03 0556) Resp:  [16-18] 18 (04/03 0556) BP: (146-164)/(67-79) 148/79 mmHg (04/03 0556) SpO2:  [94 %-97 %] 97 % (04/03 0556) Weight:  [190 lb 3.2 oz (86.274 kg)] 190 lb 3.2 oz (86.274 kg) (04/02 1435)  Intake/Output from previous day: 04/02 0701 - 04/03 0700 In: 2397.9 [I.V.:1897.9; IV Piggyback:500] Out: 1175 [Urine:1175] Intake/Output this shift:    Physical Exam:  Constitutional: Alert and oriented, No acute distress Cardiovascular: Regular rate and rhythm, No JVD Respiratory: Normal respiratory effort, Lungs clear bilaterally GI: Abdomen is soft, mildly tender and mildly distended, no abdominal masses Genitourinary: No CVAT.  Lymphatic: No lymphadenopathy Neurologic: Grossly intact, no focal deficits Psychiatric: Normal mood and affect  Lab Results:  Recent Labs  11/11/14 0917 11/12/14 0525  HGB 14.2 13.1  HCT 42.1 40.1    BMET  Recent Labs  11/11/14 0917 11/12/14 0525  NA 140 141  K 3.1* 3.1*  CL 100 104  CO2 26 28  GLUCOSE 178* 133*  BUN 21 25*  CREATININE 1.32 1.39*  CALCIUM 9.1 8.7     Studies/Results: Ct Abdomen Pelvis W Contrast  11/11/2014   CLINICAL DATA:  Mid lower abdominal pain. Pain started yesterday. Elevated white count.  EXAM: CT ABDOMEN AND PELVIS WITH CONTRAST  TECHNIQUE: Multidetector CT imaging of the abdomen and pelvis was performed using the standard protocol following bolus administration of intravenous contrast.  CONTRAST:  53mL OMNIPAQUE IOHEXOL 300 MG/ML SOLN, 163mL OMNIPAQUE IOHEXOL 300 MG/ML SOLN  COMPARISON:  08/14/2005  FINDINGS: Evidence for mild scarring at the lung bases. Negative for free intraperitoneal air.  There is a large dense lesion centered in the right renal hilum that measures 7.1 x 7.5 x 9.1 cm. A dense structure was present in this area on the previous examination measuring 3.3 x 2.7 cm on 08/14/2005. There is new moderate right hydronephrosis with extensive fluid around the right kidney extending into the right abdominal mesentery and right lower quadrant. This mesenteric and perinephric fluid is high density and concerning for blood products. There is a small amount of fluid in the left lower quadrant.  There is a 3 mm calcification in the right kidney upper pole point representing a nonobstructive stone. There are multiple exophytic structures involving the right kidney. Many of these are low density and compatible with cysts but one lesion is hyperdense on sequence 2,  image 28 measuring 1.3 cm.  Normal appearance of the adrenal glands. Multiple left renal cysts without hydronephrosis. No acute abnormality to the prostate or urinary bladder.  No acute abnormality in the liver, gallbladder or spleen. The portal venous system is patent. Normal appearance of the pancreas. Duodenal diverticulum in the third portion. Normal appearance of stomach with a small hiatal  hernia. Abdominal aorta is tortuous without aneurysm.  Left inguinal hernia containing fat and possibly a small amount of fluid. No acute abnormality in the small or large bowel. 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  IMPRESSION: There has been marked enlargement of a lesion in the right renal sinus. This lesion now measures up to 9 cm. Based on the previous imaging, this could represent large hemorrhagic cyst. However, a neoplastic process cannot be excluded on these images. Recommend follow-up characterization of this lesion with an MRI (with and without contrast).  Moderate right hydronephrosis with extensive high-density fluid around the kidney that extends into the right abdominal mesentery and right lower quadrant. The high density fluid is compatible with blood products. The blood products could be originating from the large renal lesion. The hydronephrosis is likely due to extrinsic compression from the large renal lesion.  Multiple bilateral renal cysts. Most of the cysts are low density but there is an indeterminate 1.3 cm lesion in the right kidney upper pole. This could also be further evaluated with an MRI.  These results were called by telephone at the time of interpretation on 11/11/2014 at 11:23 am to Dr. Blanchie Dessert , who verbally acknowledged these results.   Electronically Signed   By: Markus Daft M.D.   On: 11/11/2014 11:26    Assessment/Plan:  8M with abdominal pain and distention in the setting of a known right renal cortical cyst that enlarged from 6x6 to 7x7 cm over the last year. He now has intraperitoneal fluid that is likely causing partial ileus and contributing to his abdominal symptoms. It is unclear if this cyst is in fact hemorrhagic, why the fluid would be tracking from the retroperitoneum into his peritoneal cavity, as typically these bleeds are contained in the retroperitoneum.  1. Renal cysts - dominant cyst is likely hemorrhagic but essentially  stable in size from 1 year ago with similar density. There is fluid in there peritoneal space, which would be unusual for a retroperitoneal cyst to contribute to fluid in the peritoneum. It is also unlikely that the interval increase in size of 1x1 cm over the course of 1 year would cause any flank pain and it does not appear that there is significant urinary obstruction. It is possible that this cyst is a red herring and that the true cause of his abdominal pain and distention is in fact GI related.   Follow up MRI today If Cr does not improve, may consider draining cyst or a retrograde pyelogram with stent placement. Observation and interval repeat imaging in several months is also a reasonable strategy as long as symptoms and Hgb stabilize. No need for bed rest  2. LUTS - transient increase in frequency/urgency which occurred along with abdominal pain & constipation, now resolved. No acute intervention.    LOS: 1 day   Dontavia Brand C 11/12/2014, 8:22 AM

## 2014-11-12 NOTE — Progress Notes (Addendum)
Progress Note   Erik Price IOX:[id number] DOB: 1931-04-19 DOA: 11/11/2014 PCP: Jerlyn Ly, MD   Brief Narrative:   Erik Price is an 81 y.o. male with a PMH of benign renal cystic disease and BPH s/p TURP, followed by Dr. Karsten Ro, who was admitted 11/11/14 with a chief complaint of the sudden onset of acute lower abdominal pain. CT scan done on admission shows multiple bilateral renal cysts, a 9 cm lesion in the right renal sinus possibly consistent with a large hemorrhagic cyst however a neoplastic process could not be excluded, moderate right hydronephrosis with extensive high density fluid around the kidney that extends into the right abdominal mesentery and right lower quadrant. Urology has seen the patient in consultation with recommendations for MRI.  Assessment/Plan:   Principal Problem:  Abdominal pain with right hydronephrosis - CT scan shows possible ruptured hemorrhagic cyst versus neoplastic growth and right hydronephrosis. - MRI ordered to better characterize. - Urology following. - Hold aspirin. - Given low-grade fever and leukocytosis, will treat with empiric Zosyn until MRI can further characterize intra-abdominal process.  Active Problems:   AKI - I suspect that the patient has compromise of renal function from right kidney cyst causing obstructive uropathy.   - Urology following.  May need stent if MRI shows hydronephrosis/ureteral obstruction. - Baseline creatine 10/15 was 1.1.    Hypokalemia - Potassium runs ordered.   Hypertension - Continue Norvasc and metoprolol. Hold HCTZ/lisinopril.   Gout - Continue allopurinol.   Diabetes mellitus without complication - Diet controlled.   Bilateral renal cysts - Urology following.   DVT prophylaxis - SCDs ordered.  Code Status: Full. Has an advanced directive, wife is POA. Family Communication: Erik Price (wife) [phone] updated at bedside.  Disposition Plan: Home when  stable.   IV Access:    Peripheral IV   Procedures and diagnostic studies:   Ct Abdomen Pelvis W Contrast 11/11/2014: There has been marked enlargement of a lesion in the right renal sinus. This lesion now measures up to 9 cm. Based on the previous imaging, this could represent large hemorrhagic cyst. However, a neoplastic process cannot be excluded on these images. Recommend follow-up characterization of this lesion with an MRI (with and without contrast).  Moderate right hydronephrosis with extensive high-density fluid around the kidney that extends into the right abdominal mesentery and right lower quadrant. The high density fluid is compatible with blood products. The blood products could be originating from the large renal lesion. The hydronephrosis is likely due to extrinsic compression from the large renal lesion.  Multiple bilateral renal cysts. Most of the cysts are low density but there is an indeterminate 1.3 cm lesion in the right kidney upper pole. This could also be further evaluated with an MRI.    Medical Consultants:    Dr. Carolan Clines, Urology.  Anti-Infectives:    Zosyn 11/12/14--->  Subjective:   Erik Price still has residual soreness, no nausea or vomiting.  Had a small BM this a.m. No dysuria.  Appetite reasonable.     Objective:    Filed Vitals:   11/11/14 1454 11/11/14 2139 11/11/14 2153 11/12/14 0556  BP: 164/73 146/67 153/77 148/79  Pulse: 72 15 70 66  Temp: 99.5 F (37.5 C)  100.4 F (38 C) 98.7 F (37.1 C)  TempSrc: Oral  Oral Oral  Resp: 16  18 18   Height:      Weight:      SpO2: 94%  94% 97%  Intake/Output Summary (Last 24 hours) at 11/12/14 0813 Last data filed at 11/12/14 0745  Gross per 24 hour  Intake 2397.92 ml  Output   1175 ml  Net 1222.92 ml    Exam: Gen:  NAD Cardiovascular:  RRR, No M/R/G Respiratory:  Lungs CTAB Gastrointestinal:  Abdomen softly distended, mildly tender lower abdomen, + BS Extremities:   No C/E/C   Data Reviewed:    Labs: Basic Metabolic Panel:  Recent Labs Lab 11/11/14 0917 11/12/14 0525  NA 140 141  K 3.1* 3.1*  CL 100 104  CO2 26 28  GLUCOSE 178* 133*  BUN 21 25*  CREATININE 1.32 1.39*  CALCIUM 9.1 8.7   GFR Estimated Creatinine Clearance: 43.6 mL/min (by C-G formula based on Cr of 1.39). Liver Function Tests:  Recent Labs Lab 11/11/14 0917  AST 25  ALT 19  ALKPHOS 56  BILITOT 1.2  PROT 7.5  ALBUMIN 4.2    Recent Labs Lab 11/11/14 0917  LIPASE 18   CBC:  Recent Labs Lab 11/11/14 0917 11/12/14 0525  WBC 15.5* 13.3*  NEUTROABS 13.7*  --   HGB 14.2 13.1  HCT 42.1 40.1  MCV 87.3 87.9  PLT 149* 159   Sepsis Labs:  Recent Labs Lab 11/11/14 0917 11/11/14 0927 11/12/14 0525  WBC 15.5*  --  13.3*  LATICACIDVEN  --  2.37*  --    Microbiology No results found for this or any previous visit (from the past 240 hour(s)).   Medications:   . allopurinol  150 mg Oral QHS  . amLODipine  10 mg Oral q morning - 10a  . antiseptic oral rinse  7 mL Mouth Rinse q12n4p  . chlorhexidine  15 mL Mouth Rinse BID  . fluticasone  2 spray Each Nare QHS  . loratadine  10 mg Oral Daily  . metoprolol  50 mg Oral BID  . multivitamin with minerals  1 tablet Oral q morning - 10a  . omega-3 acid ethyl esters  1 g Oral TID  . simvastatin  20 mg Oral QHS   Continuous Infusions: . sodium chloride 1,000 mL (11/12/14 0647)    Time spent: 25 minutes.    LOS: 1 day   Erik Price  [facility] Pager [phone]. If unable to reach me by pager, please call my cell phone at [phone].  *Please refer to [email or web address], password [email or web address] to get updated schedule on who will round on this patient, as hospitalists switch teams weekly. If 7PM-7AM, please contact night-coverage at [email or web address], password [email or web address] for any overnight needs.  11/12/2014, 8:13 AM

## 2014-11-13 DIAGNOSIS — I10 Essential (primary) hypertension: Secondary | ICD-10-CM | POA: Insufficient documentation

## 2014-11-13 DIAGNOSIS — N281 Cyst of kidney, acquired: Secondary | ICD-10-CM | POA: Insufficient documentation

## 2014-11-13 LAB — CBC
HCT: 33.7 % — ABNORMAL LOW (ref 39.0–52.0)
HEMOGLOBIN: 11.1 g/dL — AB (ref 13.0–17.0)
MCH: 28.8 pg (ref 26.0–34.0)
MCHC: 32.9 g/dL (ref 30.0–36.0)
MCV: 87.5 fL (ref 78.0–100.0)
Platelets: 124 10*3/uL — ABNORMAL LOW (ref 150–400)
RBC: 3.85 MIL/uL — ABNORMAL LOW (ref 4.22–5.81)
RDW: 15 % (ref 11.5–15.5)
WBC: 10.4 10*3/uL (ref 4.0–10.5)

## 2014-11-13 LAB — BASIC METABOLIC PANEL
Anion gap: 8 (ref 5–15)
BUN: 23 mg/dL (ref 6–23)
CO2: 26 mmol/L (ref 19–32)
Calcium: 8.2 mg/dL — ABNORMAL LOW (ref 8.4–10.5)
Chloride: 108 mmol/L (ref 96–112)
Creatinine, Ser: 1.3 mg/dL (ref 0.50–1.35)
GFR calc Af Amer: 57 mL/min — ABNORMAL LOW (ref >90)
GFR calc non Af Amer: 49 mL/min — ABNORMAL LOW (ref >90)
GLUCOSE: 126 mg/dL — AB (ref 70–99)
POTASSIUM: 3.8 mmol/L (ref 3.5–5.1)
SODIUM: 142 mmol/L (ref 135–145)

## 2014-11-13 MED ORDER — DOCUSATE SODIUM 100 MG PO CAPS
400.0000 mg | ORAL_CAPSULE | Freq: Every day | ORAL | Status: DC
  Administered 2014-11-13: 400 mg via ORAL
  Filled 2014-11-13: qty 4

## 2014-11-13 MED ORDER — CIPROFLOXACIN HCL 500 MG PO TABS: 500.0000 mg | ORAL_TABLET | Freq: Two times a day (BID) | ORAL | Status: AC

## 2014-11-13 MED ORDER — DOCUSATE SODIUM 100 MG PO CAPS: 400.0000 mg | ORAL_CAPSULE | Freq: Every day | ORAL | Status: AC

## 2014-11-13 NOTE — Discharge Summary (Signed)
Physician Discharge Summary  Erik Price ZJI:[id number] DOB: Apr 19, 1931 DOA: 11/11/2014  PCP: Jerlyn Ly, MD  Admit date: 11/11/2014 Discharge date: 11/13/2014   Recommendations for Outpatient Follow-Up:   1. The patient will F/U with Dr. Loel Lofty in 3 months for ongoing assessment of complex renal cyst. 2. Recommend repeat check of creatinine within one week to monitor for recovery of renal function.   Discharge Diagnosis:   Principal Problem:    Abdominal pain thought to be from infected renal cyst Active Problems:    Hypertension    Gout    Diabetes mellitus without complication    Hydronephrosis, right    Bilateral renal cysts    Essential hypertension    Ruptured hemorrhagic right renal cyst, infected   Discharge Condition: Improved.  Diet recommendation: Low sodium, heart healthy.     History of Present Illness:   Erik Price is an 81 y.o. male with a PMH of benign renal cystic disease and BPH s/p TURP, followed by Dr. Karsten Ro, who was admitted 11/11/14 with a chief complaint of the sudden onset of acute lower abdominal pain. CT scan done on admission shows multiple bilateral renal cysts, a 9 cm lesion in the right renal sinus possibly consistent with a large hemorrhagic cyst however a neoplastic process could not be excluded, moderate right hydronephrosis with extensive high density fluid around the kidney that extends into the right abdominal mesentery and right lower quadrant. Urology has seen the patient in consultation with recommendations for MRI.   Hospital Course by Problem:   Principal Problem:  Abdominal pain with right hydronephrosis - CT scan shows possible ruptured hemorrhagic cyst versus neoplastic growth and right hydronephrosis. - MRI showed a 0.5 cm complex hemorrhagic cyst in the right renal sinus with moderate right renal pelvicaliectasis with mass effect. - Urology following. - Given low-grade fever and leukocytosis, will  treat with empiric Zosyn until MRI can further characterize intra-abdominal process. We'll discharge home on an additional 6 days of treatment with Cipro. - Patient was instructed to hold aspirin for a total of one week.  Active Problems:  Constipation - Patient was started on when necessary MiraLAX 11/11/14. - He was given a Dulcolax suppository with good results prior to discharge.   AKI  - Urology following. Not felt to need a stent at this time. Creatinine improving. - Baseline creatinine 10/15 was 1.1.   Hypokalemia - Potassium now WNL after being provided with supplementation.   Hypertension - Continue Norvasc and metoprolol. Hold HCTZ/lisinopril.   Gout - Continue allopurinol.   Diabetes mellitus without complication - Diet controlled.   Bilateral renal cysts - Urology following.    Medical Consultants:    Dr. Carolan Clines, Urology.   Discharge Exam:   Filed Vitals:   11/13/14 0614  BP: 154/78  Pulse: 71  Temp: 98.4 F (36.9 C)  Resp: 18   Filed Vitals:   11/12/14 1036 11/12/14 1400 11/12/14 2214 11/13/14 0614  BP: 162/65 165/70 140/68 154/78  Pulse: 68 82 84 71  Temp:  99.8 F (37.7 C) 100.4 F (38 C) 98.4 F (36.9 C)  TempSrc:  Oral Oral Oral  Resp:  18 18 18   Height:      Weight:      SpO2:  95% 96% 92%    Gen:  NAD Cardiovascular:  RRR, No M/R/G Respiratory: Lungs CTAB Gastrointestinal: Abdomen soft, minimally tender lower abdomen with normal active bowel sounds. Extremities: No C/E/C   The results of significant diagnostics  from this hospitalization (including imaging, microbiology, ancillary and laboratory) are listed below for reference.     Procedures and Diagnostic Studies:   Ct Abdomen Pelvis W Contrast 11/11/2014: There has been marked enlargement of a lesion in the right renal sinus. This lesion now measures up to 9 cm. Based on the previous imaging, this could represent large hemorrhagic cyst. However, a neoplastic  process cannot be excluded on these images. Recommend follow-up characterization of this lesion with an MRI (with and without contrast). Moderate right hydronephrosis with extensive high-density fluid around the kidney that extends into the right abdominal mesentery and right lower quadrant. The high density fluid is compatible with blood products. The blood products could be originating from the large renal lesion. The hydronephrosis is likely due to extrinsic compression from the large renal lesion. Multiple bilateral renal cysts. Most of the cysts are low density but there is an indeterminate 1.3 cm lesion in the right kidney upper pole. This could also be further evaluated with an MRI.  Mr Abdomen W Wo Contrast 11/12/2014: 8.5 cm complex hemorrhagic cyst in the right renal sinus, consistent with indeterminate Bosniak category 2 F lesion due to size. In the setting of urinary tract infection, and infected renal cyst cannot be excluded ; clinical correlation is recommended. Continued followup by MRI is also recommended 6 months. This recommendation follows ACR consensus guidelines: Managing Incidental Findings on Abdominal CT: White Paper of the ACR Incidental Findings Committee. J Am Coll Radiol 2010;7:754-773.  Moderate right renal pelvicaliectasis, likely due to mass effect by the above-described renal cyst protruding into the renal sinus.  Stable asymmetric right perinephric stranding and fluid.  Multiple other small benign appearing bilateral renal cysts.  Cholelithiasis, without evidence of cholecystitis or biliary dilatation.       Labs:   Basic Metabolic Panel:  Recent Labs Lab 11/11/14 0917 11/12/14 0525 11/13/14 0522  NA 140 141 142  K 3.1* 3.1* 3.8  CL 100 104 108  CO2 26 28 26   GLUCOSE 178* 133* 126*  BUN 21 25* 23  CREATININE 1.32 1.39* 1.30  CALCIUM 9.1 8.7 8.2*   GFR Estimated Creatinine Clearance: 46.7 mL/min (by C-G formula based on Cr of 1.3). Liver Function  Tests:  Recent Labs Lab 11/11/14 0917  AST 25  ALT 19  ALKPHOS 56  BILITOT 1.2  PROT 7.5  ALBUMIN 4.2    Recent Labs Lab 11/11/14 0917  LIPASE 18   CBC:  Recent Labs Lab 11/11/14 0917 11/12/14 0525 11/13/14 0522  WBC 15.5* 13.3* 10.4  NEUTROABS 13.7*  --   --   HGB 14.2 13.1 11.1*  HCT 42.1 40.1 33.7*  MCV 87.3 87.9 87.5  PLT 149* 159 124*    Discharge Instructions:   Discharge Instructions    Call MD for:  persistant nausea and vomiting    Complete by:  As directed      Call MD for:  severe uncontrolled pain    Complete by:  As directed      Call MD for:  temperature >100.4    Complete by:  As directed      Diet - low sodium heart healthy    Complete by:  As directed      Discharge instructions    Complete by:  As directed   You may resume your aspirin on 11/18/14.  Call your PCP for worsening abdominal distention or pain.     Increase activity slowly    Complete by:  As directed  Medication List    STOP taking these medications        aspirin EC 81 MG tablet      TAKE these medications        allopurinol 300 MG tablet  Commonly known as:  ZYLOPRIM  Take 150 mg by mouth at bedtime.     amLODipine 10 MG tablet  Commonly known as:  NORVASC  Take 10 mg by mouth every morning.     ciprofloxacin 500 MG tablet  Commonly known as:  CIPRO  Take 1 tablet (500 mg total) by mouth 2 (two) times daily.     docusate sodium 100 MG capsule  Commonly known as:  COLACE  Take 4 capsules (400 mg total) by mouth daily.     fexofenadine 180 MG tablet  Commonly known as:  ALLEGRA  Take 90 mg by mouth every morning.     Fish Oil 1000 MG Caps  Take 1,000 mg by mouth 3 (three) times daily.     fluticasone 50 MCG/ACT nasal spray  Commonly known as:  FLONASE  Place 2 sprays into both nostrils at bedtime.     hydrochlorothiazide 25 MG tablet  Commonly known as:  HYDRODIURIL  Take 25 mg by mouth every morning.     lisinopril 20 MG tablet   Commonly known as:  PRINIVIL,ZESTRIL  Take 20 mg by mouth every morning.     Methylcellulose (Laxative) 500 MG Tabs  Take 1 tablet by mouth daily at 12 noon.     metoprolol 50 MG tablet  Commonly known as:  LOPRESSOR  Take 50 mg by mouth 2 (two) times daily.     multivitamin with minerals Tabs tablet  Take 1 tablet by mouth every morning.     potassium chloride SA 20 MEQ tablet  Commonly known as:  K-DUR,KLOR-CON  Take 20 mEq by mouth 3 (three) times daily.     simvastatin 40 MG tablet  Commonly known as:  ZOCOR  Take 20 mg by mouth at bedtime.     VIAGRA 100 MG tablet  Generic drug:  sildenafil  Take 100 mg by mouth as needed for erectile dysfunction.           Follow-up Information    Follow up with Claybon Jabs, MD.   Specialty:  Urology   Why:  Call to schedule an appointment in 3 months.   Contact information:   [place] [address] [phone]       Follow up with Jerlyn Ly, MD.   Specialty:  Internal Medicine   Contact information:   [address] [place] [address] [phone]        Time coordinating discharge: 35 minutes.  Signed:  Caffie Sotto  Pager [phone] [facility] 11/13/2014, 3:29 PM

## 2014-11-13 NOTE — Progress Notes (Signed)
Subjective: Patient reports no voiding or urinary symptoms this morning. He has no right flank pain. He said he is a little uncomfortable in the abdomen and says he feels slightly distended but has not had a bowel movement in several days and typically uses a laxative at home.  Objective: Vital signs in last 24 hours: Temp:  [98.4 F (36.9 C)-100.4 F (38 C)] 98.4 F (36.9 C) (04/04 0614) Pulse Rate:  [68-84] 71 (04/04 0614) Resp:  [18] 18 (04/04 0614) BP: (140-165)/(65-78) 154/78 mmHg (04/04 0614) SpO2:  [92 %-96 %] 92 % (04/04 0614)  Intake/Output from previous day: 04/03 0701 - 04/04 0700 In: 4520 [P.O.:920; I.V.:3000; IV Piggyback:600] Out: 1700 [Urine:1700] Intake/Output this shift:    Physical Exam:  His right flank is nontender to percussion. His abdomen is slightly distended  Lab Results:  Recent Labs  11/11/14 0917 11/12/14 0525 11/13/14 0522  HGB 14.2 13.1 11.1*  HCT 42.1 40.1 33.7*   BMET  Recent Labs  11/12/14 0525 11/13/14 0522  NA 141 142  K 3.1* 3.8  CL 104 108  CO2 28 26  GLUCOSE 133* 126*  BUN 25* 23  CREATININE 1.39* 1.30  CALCIUM 8.7 8.2*   No results for input(s): LABPT, INR in the last 72 hours. No results for input(s): LABURIN in the last 72 hours. No results found for this or any previous visit.  Studies/Results: Mr Abdomen W Wo Contrast  11/12/2014   CLINICAL DATA:  Indeterminate right renal mass seen on recent CT. Abdominal pain and leukocytosis.  EXAM: MRI ABDOMEN WITHOUT AND WITH CONTRAST  TECHNIQUE: Multiplanar multisequence MR imaging of the abdomen was performed both before and after the administration of intravenous contrast.  CONTRAST:  17mL MULTIHANCE GADOBENATE DIMEGLUMINE 529 MG/ML IV SOLN  COMPARISON:  CT on 11/11/2014 and 08/14/2005  FINDINGS: Lower chest:  Unremarkable.  Hepatobiliary: No masses or other significant abnormality identified. Cholelithiasis is demonstrated, however there is no evidence of acute cholecystitis  or biliary ductal dilatation.  Pancreas: No mass, inflammatory changes, or other significant abnormality identified.  Spleen:  Within normal limits in size and appearance.  Adrenal Glands:  No masses identified.  Kidneys: 8.5 cm complex T1 hyperintense cyst with a few thin internal septations is seen in the right renal sinus. This lesion shows no evidence of contrast enhancement on dynamic imaging, and is consistent with a large complex hemorrhagic cyst, indeterminate Bosniak category 2 F due to size. Moderate right renal pelvicaliectasis is again seen likely due to mass effect by the large cyst located within the renal sinus. Asymmetric right perinephric stranding and fluid also shows no significant change.  Multiple other smaller benign-appearing cysts are seen in both kidneys. No other suspicious renal lesions are seen.  Stomach/Bowel/Peritoneum: No evidence of wall thickening, mass, or obstruction involving visualized abdominal bowel.  Vascular/Lymphatic: No pathologically enlarged lymph nodes identified. No other significant abnormality noted.  Other:  None.  Musculoskeletal:  No suspicious bone lesions identified.  IMPRESSION: 8.5 cm complex hemorrhagic cyst in the right renal sinus, consistent with indeterminate Bosniak category 2 F lesion due to size. In the setting of urinary tract infection, and infected renal cyst cannot be excluded ; clinical correlation is recommended. Continued followup by MRI is also recommended 6 months. This recommendation follows ACR consensus guidelines: Managing Incidental Findings on Abdominal CT: White Paper of the ACR Incidental Findings Committee. J Am Coll Radiol 2010;7:754-773.  Moderate right renal pelvicaliectasis, likely due to mass effect by the above-described renal cyst protruding  into the renal sinus.  Stable asymmetric right perinephric stranding and fluid.  Multiple other small benign appearing bilateral renal cysts.  Cholelithiasis, without evidence of  cholecystitis or biliary dilatation.   Electronically Signed   By: Earle Gell M.D.   On: 11/12/2014 14:36   Ct Abdomen Pelvis W Contrast  11/11/2014   CLINICAL DATA:  Mid lower abdominal pain. Pain started yesterday. Elevated white count.  EXAM: CT ABDOMEN AND PELVIS WITH CONTRAST  TECHNIQUE: Multidetector CT imaging of the abdomen and pelvis was performed using the standard protocol following bolus administration of intravenous contrast.  CONTRAST:  85mL OMNIPAQUE IOHEXOL 300 MG/ML SOLN, 184mL OMNIPAQUE IOHEXOL 300 MG/ML SOLN  COMPARISON:  08/14/2005  FINDINGS: Evidence for mild scarring at the lung bases. Negative for free intraperitoneal air.  There is a large dense lesion centered in the right renal hilum that measures 7.1 x 7.5 x 9.1 cm. A dense structure was present in this area on the previous examination measuring 3.3 x 2.7 cm on 08/14/2005. There is new moderate right hydronephrosis with extensive fluid around the right kidney extending into the right abdominal mesentery and right lower quadrant. This mesenteric and perinephric fluid is high density and concerning for blood products. There is a small amount of fluid in the left lower quadrant.  There is a 3 mm calcification in the right kidney upper pole point representing a nonobstructive stone. There are multiple exophytic structures involving the right kidney. Many of these are low density and compatible with cysts but one lesion is hyperdense on sequence 2, image 28 measuring 1.3 cm.  Normal appearance of the adrenal glands. Multiple left renal cysts without hydronephrosis. No acute abnormality to the prostate or urinary bladder.  No acute abnormality in the liver, gallbladder or spleen. The portal venous system is patent. Normal appearance of the pancreas. Duodenal diverticulum in the third portion. Normal appearance of stomach with a small hiatal hernia. Abdominal aorta is tortuous without aneurysm.  Left inguinal hernia containing fat and possibly  a small amount of fluid. No acute abnormality in the small or large bowel. 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  IMPRESSION: There has been marked enlargement of a lesion in the right renal sinus. This lesion now measures up to 9 cm. Based on the previous imaging, this could represent large hemorrhagic cyst. However, a neoplastic process cannot be excluded on these images. Recommend follow-up characterization of this lesion with an MRI (with and without contrast).  Moderate right hydronephrosis with extensive high-density fluid around the kidney that extends into the right abdominal mesentery and right lower quadrant. The high density fluid is compatible with blood products. The blood products could be originating from the large renal lesion. The hydronephrosis is likely due to extrinsic compression from the large renal lesion.  Multiple bilateral renal cysts. Most of the cysts are low density but there is an indeterminate 1.3 cm lesion in the right kidney upper pole. This could also be further evaluated with an MRI.  These results were called by telephone at the time of interpretation on 11/11/2014 at 11:23 am to Dr. Blanchie Dessert , who verbally acknowledged these results.   Electronically Signed   By: Markus Daft M.D.   On: 11/11/2014 11:26    Assessment/Plan: Right renal cyst: This has increased slightly since it was imaged by CT scan last year and by ultrasound in 6/15. There is not a significant amount of dilation of his collecting system and in addition  he is not having any flank pain. In my opinion he does not need a double-J stent placement nor does he need cyst aspiration or marsupialization at this time. He will require follow-up for his hemorrhagic cyst. No form of surgical intervention is indicated at this time however.  His abdominal pain is in no way related to his right renal cyst. He does seem to have some mild constipation which has been a chronic problem for him.  His white blood cell count has normalized and his creatinine has improved as well. It would appear he would benefit from a laxative.   He will follow-up with me for a repeat renal ultrasound in 3 months.   LOS: 2 days   Nalleli Largent C 11/13/2014, 7:23 AM

## 2014-11-20 DIAGNOSIS — E876 Hypokalemia: Secondary | ICD-10-CM | POA: Diagnosis not present

## 2014-11-20 DIAGNOSIS — R109 Unspecified abdominal pain: Secondary | ICD-10-CM | POA: Diagnosis not present

## 2014-11-20 DIAGNOSIS — I10 Essential (primary) hypertension: Secondary | ICD-10-CM | POA: Diagnosis not present

## 2014-12-12 DIAGNOSIS — I10 Essential (primary) hypertension: Secondary | ICD-10-CM | POA: Diagnosis not present

## 2014-12-12 DIAGNOSIS — Z6826 Body mass index (BMI) 26.0-26.9, adult: Secondary | ICD-10-CM | POA: Diagnosis not present

## 2014-12-12 DIAGNOSIS — N281 Cyst of kidney, acquired: Secondary | ICD-10-CM | POA: Diagnosis not present

## 2014-12-12 DIAGNOSIS — N1330 Unspecified hydronephrosis: Secondary | ICD-10-CM | POA: Diagnosis not present

## 2014-12-12 DIAGNOSIS — R109 Unspecified abdominal pain: Secondary | ICD-10-CM | POA: Diagnosis not present

## 2014-12-12 DIAGNOSIS — E876 Hypokalemia: Secondary | ICD-10-CM | POA: Diagnosis not present

## 2014-12-20 DIAGNOSIS — E876 Hypokalemia: Secondary | ICD-10-CM | POA: Diagnosis not present

## 2015-02-14 DIAGNOSIS — N281 Cyst of kidney, acquired: Secondary | ICD-10-CM | POA: Diagnosis not present

## 2015-02-20 ENCOUNTER — Other Ambulatory Visit (HOSPITAL_COMMUNITY): Payer: Self-pay | Admitting: Urology

## 2015-02-20 DIAGNOSIS — N1339 Other hydronephrosis: Secondary | ICD-10-CM

## 2015-02-26 ENCOUNTER — Other Ambulatory Visit: Payer: Self-pay | Admitting: Radiology

## 2015-02-27 ENCOUNTER — Other Ambulatory Visit: Payer: Self-pay | Admitting: Radiology

## 2015-02-27 ENCOUNTER — Other Ambulatory Visit: Payer: Self-pay | Admitting: Physician Assistant

## 2015-02-28 ENCOUNTER — Ambulatory Visit (HOSPITAL_COMMUNITY)
Admission: RE | Admit: 2015-02-28 | Discharge: 2015-02-28 | Disposition: A | Discharge status: Home / Self Care | Payer: Medicare Other | Source: Ambulatory Visit | Attending: Urology | Admitting: Urology

## 2015-02-28 ENCOUNTER — Encounter (HOSPITAL_COMMUNITY): Payer: Self-pay

## 2015-02-28 DIAGNOSIS — Q6100 Congenital renal cyst, unspecified: Secondary | ICD-10-CM | POA: Diagnosis not present

## 2015-02-28 DIAGNOSIS — N281 Cyst of kidney, acquired: Secondary | ICD-10-CM | POA: Diagnosis not present

## 2015-02-28 DIAGNOSIS — N1339 Other hydronephrosis: Secondary | ICD-10-CM | POA: Insufficient documentation

## 2015-02-28 LAB — CBC
HEMATOCRIT: 41.5 % (ref 39.0–52.0)
Hemoglobin: 13.3 g/dL (ref 13.0–17.0)
MCH: 27.6 pg (ref 26.0–34.0)
MCHC: 32 g/dL (ref 30.0–36.0)
MCV: 86.1 fL (ref 78.0–100.0)
Platelets: 142 10*3/uL — ABNORMAL LOW (ref 150–400)
RBC: 4.82 MIL/uL (ref 4.22–5.81)
RDW: 14.9 % (ref 11.5–15.5)
WBC: 8.3 10*3/uL (ref 4.0–10.5)

## 2015-02-28 LAB — APTT: aPTT: 30 seconds (ref 24–37)

## 2015-02-28 LAB — GLUCOSE, CAPILLARY: Glucose-Capillary: 95 mg/dL (ref 65–99)

## 2015-02-28 LAB — PROTIME-INR
INR: 1.15 (ref 0.00–1.49)
Prothrombin Time: 14.9 seconds (ref 11.6–15.2)

## 2015-02-28 MED ORDER — FENTANYL CITRATE (PF) 100 MCG/2ML IJ SOLN
INTRAMUSCULAR | Status: AC | PRN
  Administered 2015-02-28: 50 ug via INTRAVENOUS

## 2015-02-28 MED ORDER — MIDAZOLAM HCL 2 MG/2ML IJ SOLN
INTRAMUSCULAR | Status: AC
  Filled 2015-02-28: qty 6

## 2015-02-28 MED ORDER — SODIUM CHLORIDE 0.9 % IV SOLN
INTRAVENOUS | Status: DC
  Administered 2015-02-28: 12:00:00 via INTRAVENOUS

## 2015-02-28 MED ORDER — FENTANYL CITRATE (PF) 100 MCG/2ML IJ SOLN
INTRAMUSCULAR | Status: AC
  Filled 2015-02-28: qty 4

## 2015-02-28 MED ORDER — MIDAZOLAM HCL 2 MG/2ML IJ SOLN
INTRAMUSCULAR | Status: AC | PRN
  Administered 2015-02-28: 0.5 mg via INTRAVENOUS
  Administered 2015-02-28: 1 mg via INTRAVENOUS

## 2015-02-28 NOTE — Discharge Instructions (Signed)
Needle Aspiration Care After These instructions give you information on caring for yourself after your procedure. Your doctor may also give you more specific instructions. Call your doctor if you have any problems or questions after your procedure. HOME CARE  Rest for 4 hours after your aspiration, except for getting up to go to the bathroom or as told.  Keep the places where the needles were put in clean and dry.  Do not put powder or lotion on the sites.  Do not shower until 24 hours after the test. Remove all bandages (dressings) before showering.  Remove all bandages at least once every day. Gently clean the sites with soap and water. Keep putting a new bandage on until the skin is closed. Finding out the results of your test Ask your doctor when your test results will be ready. Make sure you follow up and get the test results. GET HELP RIGHT AWAY IF:   You have shortness of breath or trouble breathing.  You have pain or cramping in your belly (abdomen).  You feel sick to your stomach (nauseous) or throw up (vomit).  Any of the places where the needles were put in:  Are puffy (swollen) or red.  Are sore or hot to the touch.  Are draining yellowish-white fluid (pus).  Are bleeding after 10 minutes of pressing down on the site. Have someone keep pressing on any place that is bleeding until you see a doctor.  You have any unusual pain that will not stop.  You have a fever. If you go to the emergency room, tell the nurse that you had a biopsy. Take this paper with you to show the nurse. MAKE SURE YOU:   Understand these instructions.  Will watch your condition.  Will get help right away if you are not doing well or get worse. Document Released: 07/10/2008 Document Revised: 10/20/2011 Document Reviewed: 07/10/2008 [facility] Patient Information 2015 [place], [place]. This information is not intended to replace advice given to you by your health care provider. Make sure you  discuss any questions you have with your health care provider. Conscious Sedation Sedation is the use of medicines to promote relaxation and relieve discomfort and anxiety. Conscious sedation is a type of sedation. Under conscious sedation you are less alert than normal but are still able to respond to instructions or stimulation. Conscious sedation is used during short medical and dental procedures. It is milder than deep sedation or general anesthesia and allows you to return to your regular activities sooner.  LET [facility] CARE PROVIDER KNOW ABOUT:   Any allergies you have.  All medicines you are taking, including vitamins, herbs, eye drops, creams, and over-the-counter medicines.  Use of steroids (by mouth or creams).  Previous problems you or members of your family have had with the use of anesthetics.  Any blood disorders you have.  Previous surgeries you have had.  Medical conditions you have.  Possibility of pregnancy, if this applies.  Use of cigarettes, alcohol, or illegal drugs. RISKS AND COMPLICATIONS Generally, this is a safe procedure. However, as with any procedure, problems can occur. Possible problems include:  Oversedation.  Trouble breathing on your own. You may need to have a breathing tube until you are awake and breathing on your own.  Allergic reaction to any of the medicines used for the procedure. BEFORE THE PROCEDURE  You may have blood tests done. These tests can help show how well your kidneys and liver are working. They can also  show how well your blood clots.  A physical exam will be done.  Only take medicines as directed by your health care provider. You may need to stop taking medicines (such as blood thinners, aspirin, or nonsteroidal anti-inflammatory drugs) before the procedure.   Do not eat or drink at least 6 hours before the procedure or as directed by your health care provider.  Arrange for a responsible adult, family member, or  friend to take you home after the procedure. He or she should stay with you for at least 24 hours after the procedure, until the medicine has worn off. PROCEDURE   An intravenous (IV) catheter will be inserted into one of your veins. Medicine will be able to flow directly into your body through this catheter. You may be given medicine through this tube to help prevent pain and help you relax.  The medical or dental procedure will be done. AFTER THE PROCEDURE  You will stay in a recovery area until the medicine has worn off. Your blood pressure and pulse will be checked.   Depending on the procedure you had, you may be allowed to go home when you can tolerate liquids and your pain is under control. Document Released: 04/22/2001 Document Revised: 08/02/2013 Document Reviewed: 04/04/2013 [facility] Patient Information 2015 [place], [place]. This information is not intended to replace advice given to you by your health care provider. Make sure you discuss any questions you have with your health care provider. Conscious Sedation, Adult, Care After Refer to this sheet in the next few weeks. These instructions provide you with information on caring for yourself after your procedure. Your health care provider may also give you more specific instructions. Your treatment has been planned according to current medical practices, but problems sometimes occur. Call your health care provider if you have any problems or questions after your procedure. WHAT TO EXPECT AFTER THE PROCEDURE  After your procedure:  You may feel sleepy, clumsy, and have poor balance for several hours.  Vomiting may occur if you eat too soon after the procedure. HOME CARE INSTRUCTIONS  Do not participate in any activities where you could become injured for at least 24 hours. Do not:  Drive.  Swim.  Ride a bicycle.  Operate heavy machinery.  Cook.  Use power tools.  Climb ladders.  Work from a high place.  Do not make  important decisions or sign legal documents until you are improved.  If you vomit, drink water, juice, or soup when you can drink without vomiting. Make sure you have little or no nausea before eating solid foods.  Only take over-the-counter or prescription medicines for pain, discomfort, or fever as directed by your health care provider.  Make sure you and your family fully understand everything about the medicines given to you, including what side effects may occur.  You should not drink alcohol, take sleeping pills, or take medicines that cause drowsiness for at least 24 hours.  If you smoke, do not smoke without supervision.  If you are feeling better, you may resume normal activities 24 hours after you were sedated.  Keep all appointments with your health care provider. SEEK MEDICAL CARE IF:  Your skin is pale or bluish in color.  You continue to feel nauseous or vomit.  Your pain is getting worse and is not helped by medicine.  You have bleeding or swelling.  You are still sleepy or feeling clumsy after 24 hours. SEEK IMMEDIATE MEDICAL CARE IF:  You develop a  rash.  You have difficulty breathing.  You develop any type of allergic problem.  You have a fever. MAKE SURE YOU:  Understand these instructions.  Will watch your condition.  Will get help right away if you are not doing well or get worse. Document Released: 05/18/2013 Document Reviewed: 05/18/2013 [facility] Patient Information 2015 [place], [place]. This information is not intended to replace advice given to you by your health care provider. Make sure you discuss any questions you have with your health care provider.

## 2015-02-28 NOTE — Procedures (Signed)
Technically successful CT guided aspiration of dominant right sided para-pelvic cyst yielding 200 cc of dark brown fluid. All aspirated fluid sent to lab for cytologic analysis. No immediate post procedural complications.

## 2015-02-28 NOTE — H&P (Signed)
Referring Physician(s): Ottelin,Mark  History of Present Illness: Erik Price is a 81 y.o. male with bilateral renal cysts who denies any flank pain, but was found to have right hydronephrosis and decline in renal function felt to be secondary to right parapelvis cyst. He has been seen by Urology and is scheduled today for cyst aspiration. He denies any chest pain, shortness of breath or palpitations. He denies any active signs of bleeding or excessive bruising. He denies any recent fever or chills. The patient denies any history of sleep apnea or chronic oxygen use. He has previously tolerated sedation without complications.   Past Medical History  Diagnosis Date  . Hypertension   . Gout   . Diverticulosis   . Diabetes mellitus without complication     Diet controlled, last A1c 5.9%  . Hyperlipidemia   . Bilateral renal cysts   . Left inguinal hernia   . Hiatal hernia   . BPH (benign prostatic hyperplasia)     Past Surgical History  Procedure Laterality Date  . Hernia repair    . Appendectomy    . Transurethral resection of prostate  1988  . Retinal tear repair cryotherapy  2006  . Elbow surgery Right   . Carpal tunnel release Right     Allergies: Review of patient's allergies indicates no known allergies.  Medications: Prior to Admission medications   Medication Sig Start Date End Date Taking? Authorizing Provider  allopurinol (ZYLOPRIM) 300 MG tablet Take 150 mg by mouth at bedtime.   Yes Historical Provider, MD  amLODipine (NORVASC) 10 MG tablet Take 10 mg by mouth every morning.   Yes Historical Provider, MD  docusate sodium (COLACE) 100 MG capsule Take 4 capsules (400 mg total) by mouth daily. Patient taking differently: Take 500 mg by mouth daily.  11/13/14  Yes Christina P Rama, MD  fexofenadine (ALLEGRA) 180 MG tablet Take 90 mg by mouth every morning.   Yes Historical Provider, MD  fluticasone (FLONASE) 50 MCG/ACT nasal spray Place 2 sprays into both  nostrils at bedtime.   Yes Historical Provider, MD  hydrochlorothiazide (HYDRODIURIL) 25 MG tablet Take 25 mg by mouth every morning.   Yes Historical Provider, MD  lisinopril (PRINIVIL,ZESTRIL) 20 MG tablet Take 20 mg by mouth every morning.   Yes Historical Provider, MD  Methylcellulose, Laxative, 500 MG TABS Take 1 tablet by mouth daily at 12 noon.   Yes Historical Provider, MD  metoprolol (LOPRESSOR) 50 MG tablet Take 50 mg by mouth 2 (two) times daily.   Yes Historical Provider, MD  Multiple Vitamin (MULTIVITAMIN WITH MINERALS) TABS tablet Take 1 tablet by mouth every morning.   Yes Historical Provider, MD  Omega-3 Fatty Acids (FISH OIL) 1000 MG CAPS Take 1,000 mg by mouth 3 (three) times daily.   Yes Historical Provider, MD  potassium chloride SA (K-DUR,KLOR-CON) 20 MEQ tablet Take 20 mEq by mouth as directed. Alternating BID and TID   Yes Historical Provider, MD  simvastatin (ZOCOR) 40 MG tablet Take 20 mg by mouth at bedtime.   Yes Historical Provider, MD  VIAGRA 100 MG tablet Take 100 mg by mouth as needed for erectile dysfunction.  09/29/14  Yes Historical Provider, MD  aspirin 81 MG tablet Take 81 mg by mouth daily.    Historical Provider, MD  ciprofloxacin (CIPRO) 500 MG tablet Take 1 tablet (500 mg total) by mouth 2 (two) times daily. Patient not taking: Reported on 02/23/2015 11/13/14   Venetia Maxon Rama, MD  Family History  Problem Relation Age of Onset  . Heart attack Father     Died at age 64  . Ulcers Mother   . Rheum arthritis Mother     History   Social History  . Marital Status: Married    Spouse Name: N/A  . Number of Children: 2  . Years of Education: N/A   Occupational History  . Social research officer, government    Social History Main Topics  . Smoking status: Never Smoker   . Smokeless tobacco: Not on file  . Alcohol Use: 0.0 oz/week    0 Standard drinks or equivalent per week     Comment: 1 beer or a glass of week per week  . Drug Use: No  . Sexual Activity: Not on  file   Other Topics Concern  . None   Social History Narrative   Married.  Lives with his wife in a retirement community.  Ambulates without assistance.   Review of Systems: A 12 point ROS discussed and pertinent positives are indicated in the HPI above.  All other systems are negative.  Review of Systems  Vital Signs: BP 155/82 mmHg  Pulse 58  Temp(Src) 98 F (36.7 C) (Oral)  Resp 18  Ht 5\' 11"  (1.803 m)  Wt 188 lb (85.276 kg)  BMI 26.23 kg/m2  SpO2 96%  Physical Exam  Constitutional: He is oriented to person, place, and time. No distress.  HENT:  Head: Normocephalic and atraumatic.  Neck: No tracheal deviation present.  Cardiovascular: Normal rate and regular rhythm.  Exam reveals no friction rub.   No murmur heard. Pulmonary/Chest: Effort normal and breath sounds normal. No respiratory distress. He has no wheezes. He has no rales.  Abdominal: Soft. Bowel sounds are normal. He exhibits no distension and no mass. There is no tenderness.  No CVA tenderness  Neurological: He is alert and oriented to person, place, and time.  Skin: He is not diaphoretic.    Mallampati Score:  MD Evaluation Airway: WNL Heart: WNL Abdomen: WNL Chest/ Lungs: WNL ASA  Classification: 2 Mallampati/Airway Score: One  Imaging: No results found.  Labs:  CBC:  Recent Labs  11/11/14 0917 11/12/14 0525 11/13/14 0522 02/28/15 1130  WBC 15.5* 13.3* 10.4 8.3  HGB 14.2 13.1 11.1* 13.3  HCT 42.1 40.1 33.7* 41.5  PLT 149* 159 124* 142*    COAGS:  Recent Labs  02/28/15 1130  INR 1.15  APTT 30    BMP:  Recent Labs  11/11/14 0917 11/12/14 0525 11/13/14 0522  NA 140 141 142  K 3.1* 3.1* 3.8  CL 100 104 108  CO2 26 28 26   GLUCOSE 178* 133* 126*  BUN 21 25* 23  CALCIUM 9.1 8.7 8.2*  CREATININE 1.32 1.39* 1.30  GFRNONAA 49* 46* 49*  GFRAA 56* 53* 57*    LIVER FUNCTION TESTS:  Recent Labs  11/11/14 0917  BILITOT 1.2  AST 25  ALT 19  ALKPHOS 56  PROT 7.5    ALBUMIN 4.2    Assessment and Plan: Right parapelvis cyst Right hydronephrosis and decline in renal function felt to be secondary to cyst Seen by Dr. Karsten Ro  Scheduled for image guided right parapelvis cyst aspiration with sedation The patient has been NPO, no blood thinners taken, labs and vitals have been reviewed. Risks and Benefits discussed with the patient including, but not limited to bleeding, infection or damage to adjacent structures. All of the patient's questions were answered, patient is agreeable to proceed. Consent  signed and in chart.   Thank you for this interesting consult.  I greatly enjoyed meeting Willson Lipa and look forward to participating in their care.  A copy of this report was sent to the requesting provider on this date.  SignedHedy Jacob 02/28/2015, 12:22 PM   I spent a total of 30 Minutes in face to face in clinical consultation, greater than 50% of which was counseling/coordinating care for right parapelvis cyst.

## 2015-03-08 DIAGNOSIS — M4306 Spondylolysis, lumbar region: Secondary | ICD-10-CM | POA: Diagnosis not present

## 2015-03-08 DIAGNOSIS — M4816 Ankylosing hyperostosis [Forestier], lumbar region: Secondary | ICD-10-CM | POA: Diagnosis not present

## 2015-03-08 DIAGNOSIS — M4317 Spondylolisthesis, lumbosacral region: Secondary | ICD-10-CM | POA: Diagnosis not present

## 2015-03-09 DIAGNOSIS — M5126 Other intervertebral disc displacement, lumbar region: Secondary | ICD-10-CM | POA: Diagnosis not present

## 2015-03-09 DIAGNOSIS — M47816 Spondylosis without myelopathy or radiculopathy, lumbar region: Secondary | ICD-10-CM | POA: Diagnosis not present

## 2015-03-09 DIAGNOSIS — M4307 Spondylolysis, lumbosacral region: Secondary | ICD-10-CM | POA: Diagnosis not present

## 2015-03-09 DIAGNOSIS — M4317 Spondylolisthesis, lumbosacral region: Secondary | ICD-10-CM | POA: Diagnosis not present

## 2015-03-09 DIAGNOSIS — M47817 Spondylosis without myelopathy or radiculopathy, lumbosacral region: Secondary | ICD-10-CM | POA: Diagnosis not present

## 2015-03-09 DIAGNOSIS — M5137 Other intervertebral disc degeneration, lumbosacral region: Secondary | ICD-10-CM | POA: Diagnosis not present

## 2015-03-13 ENCOUNTER — Other Ambulatory Visit: Payer: Self-pay | Admitting: Neurological Surgery

## 2015-03-13 DIAGNOSIS — M21372 Foot drop, left foot: Secondary | ICD-10-CM | POA: Diagnosis not present

## 2015-03-13 DIAGNOSIS — I10 Essential (primary) hypertension: Secondary | ICD-10-CM | POA: Diagnosis not present

## 2015-03-13 DIAGNOSIS — M5126 Other intervertebral disc displacement, lumbar region: Secondary | ICD-10-CM | POA: Diagnosis not present

## 2015-03-13 DIAGNOSIS — Z6827 Body mass index (BMI) 27.0-27.9, adult: Secondary | ICD-10-CM | POA: Diagnosis not present

## 2015-03-15 ENCOUNTER — Ambulatory Visit (HOSPITAL_COMMUNITY)
Admission: RE | Admit: 2015-03-15 | Discharge: 2015-03-15 | Disposition: A | Discharge status: Home / Self Care | Payer: Medicare Other | Source: Ambulatory Visit | Attending: Neurological Surgery | Admitting: Neurological Surgery

## 2015-03-15 ENCOUNTER — Encounter (HOSPITAL_COMMUNITY): Payer: Self-pay

## 2015-03-15 ENCOUNTER — Encounter (HOSPITAL_COMMUNITY)
Admission: RE | Admit: 2015-03-15 | Discharge: 2015-03-15 | Disposition: A | Discharge status: Home / Self Care | Payer: Medicare Other | Source: Ambulatory Visit | Attending: Neurological Surgery | Admitting: Neurological Surgery

## 2015-03-15 ENCOUNTER — Other Ambulatory Visit (HOSPITAL_COMMUNITY): Payer: Self-pay | Admitting: *Deleted

## 2015-03-15 DIAGNOSIS — Z01818 Encounter for other preprocedural examination: Secondary | ICD-10-CM | POA: Insufficient documentation

## 2015-03-15 DIAGNOSIS — Z79899 Other long term (current) drug therapy: Secondary | ICD-10-CM | POA: Diagnosis not present

## 2015-03-15 DIAGNOSIS — R7309 Other abnormal glucose: Secondary | ICD-10-CM | POA: Insufficient documentation

## 2015-03-15 DIAGNOSIS — Z01810 Encounter for preprocedural cardiovascular examination: Secondary | ICD-10-CM | POA: Insufficient documentation

## 2015-03-15 DIAGNOSIS — M479 Spondylosis, unspecified: Secondary | ICD-10-CM | POA: Insufficient documentation

## 2015-03-15 DIAGNOSIS — E119 Type 2 diabetes mellitus without complications: Secondary | ICD-10-CM | POA: Insufficient documentation

## 2015-03-15 DIAGNOSIS — Z01812 Encounter for preprocedural laboratory examination: Secondary | ICD-10-CM | POA: Diagnosis not present

## 2015-03-15 DIAGNOSIS — Z7982 Long term (current) use of aspirin: Secondary | ICD-10-CM | POA: Diagnosis not present

## 2015-03-15 DIAGNOSIS — I10 Essential (primary) hypertension: Secondary | ICD-10-CM | POA: Insufficient documentation

## 2015-03-15 DIAGNOSIS — IMO0002 Reserved for concepts with insufficient information to code with codable children: Secondary | ICD-10-CM

## 2015-03-15 DIAGNOSIS — E785 Hyperlipidemia, unspecified: Secondary | ICD-10-CM | POA: Insufficient documentation

## 2015-03-15 HISTORY — DX: Personal history of malaria: Z86.13

## 2015-03-15 HISTORY — DX: Nausea with vomiting, unspecified: R11.2

## 2015-03-15 HISTORY — DX: Unspecified osteoarthritis, unspecified site: M19.90

## 2015-03-15 HISTORY — DX: Other specified postprocedural states: Z98.890

## 2015-03-15 HISTORY — DX: Calculus of kidney: N20.0

## 2015-03-15 HISTORY — DX: Malignant (primary) neoplasm, unspecified: C80.1

## 2015-03-15 HISTORY — DX: Deviated nasal septum: J34.2

## 2015-03-15 LAB — CBC WITH DIFFERENTIAL/PLATELET
BASOS PCT: 0 % (ref 0–1)
Basophils Absolute: 0 10*3/uL (ref 0.0–0.1)
Eosinophils Absolute: 0.1 10*3/uL (ref 0.0–0.7)
Eosinophils Relative: 1 % (ref 0–5)
HCT: 43.1 % (ref 39.0–52.0)
HEMOGLOBIN: 14.6 g/dL (ref 13.0–17.0)
LYMPHS PCT: 15 % (ref 12–46)
Lymphs Abs: 2.1 10*3/uL (ref 0.7–4.0)
MCH: 29.2 pg (ref 26.0–34.0)
MCHC: 33.9 g/dL (ref 30.0–36.0)
MCV: 86.2 fL (ref 78.0–100.0)
MONOS PCT: 10 % (ref 3–12)
Monocytes Absolute: 1.4 10*3/uL — ABNORMAL HIGH (ref 0.1–1.0)
NEUTROS ABS: 10.1 10*3/uL — AB (ref 1.7–7.7)
Neutrophils Relative %: 74 % (ref 43–77)
PLATELETS: 186 10*3/uL (ref 150–400)
RBC: 5 MIL/uL (ref 4.22–5.81)
RDW: 15.1 % (ref 11.5–15.5)
WBC: 13.6 10*3/uL — ABNORMAL HIGH (ref 4.0–10.5)

## 2015-03-15 LAB — BASIC METABOLIC PANEL
ANION GAP: 9 (ref 5–15)
BUN: 39 mg/dL — AB (ref 6–20)
CHLORIDE: 100 mmol/L — AB (ref 101–111)
CO2: 30 mmol/L (ref 22–32)
CREATININE: 1.4 mg/dL — AB (ref 0.61–1.24)
Calcium: 9.4 mg/dL (ref 8.9–10.3)
GFR calc non Af Amer: 45 mL/min — ABNORMAL LOW (ref >60)
GFR, EST AFRICAN AMERICAN: 52 mL/min — AB (ref >60)
GLUCOSE: 123 mg/dL — AB (ref 65–99)
Potassium: 3 mmol/L — ABNORMAL LOW (ref 3.5–5.1)
Sodium: 139 mmol/L (ref 135–145)

## 2015-03-15 LAB — PROTIME-INR
INR: 1.12 (ref 0.00–1.49)
PROTHROMBIN TIME: 14.6 s (ref 11.6–15.2)

## 2015-03-15 LAB — SURGICAL PCR SCREEN
MRSA, PCR: NEGATIVE
Staphylococcus aureus: NEGATIVE

## 2015-03-15 LAB — GLUCOSE, CAPILLARY: GLUCOSE-CAPILLARY: 166 mg/dL — AB (ref 65–99)

## 2015-03-15 NOTE — Progress Notes (Signed)
Pt denies cardiac history, chest pain or sob.   He does report that his electrolytes can become unbalanced with medications, especially his potassium.   Pt is diabetic, not on medications. States his fasting blood sugar is usually between 110-120.

## 2015-03-15 NOTE — Pre-Procedure Instructions (Signed)
GARVIN ELLENA  03/15/2015      Your procedure is scheduled on Thursday, March 22, 2015 at 9:30 AM.   Report to [facility] Entrance "A" Admitting Office at 7:30 AM.   Call this number if you have problems the morning of surgery: [phone]   Any questions prior to day of surgery, please call [phone] between 8 & 4 PM.   Remember:  Do not eat food or drink liquids after midnight Wednesday, 03/21/15.  Take these medicines the morning of surgery with A SIP OF WATER: Amlodipine (Norvasc), Fexofenadine (Allegra), Metoprolol (Lopressor), Tylenol - if needed.  Stop Aspirin, Multivitamins and Fish Oil as of today. Do not use NSAIDS (Ibuprofen, Aleve, etc.) prior to surgery.   Do not wear jewelry, make-up or nail polish.  Do not wear lotions, powders, or perfumes.  You may wear deodorant.  Do not shave 48 hours prior to surgery.  Men may shave face and neck.  Do not bring valuables to the hospital.  [facility] is not responsible for any belongings or valuables.  Contacts, dentures or bridgework may not be worn into surgery.  Leave your suitcase in the car.  After surgery it may be brought to your room.  For patients admitted to the hospital, discharge time will be determined by your treatment team.   Special instructions:  [place] - Preparing for Surgery  Before surgery, you can play an important role.  Because skin is not sterile, your skin needs to be as free of germs as possible.  You can reduce the number of germs on you skin by washing with CHG (chlorahexidine gluconate) soap before surgery.  CHG is an antiseptic cleaner which kills germs and bonds with the skin to continue killing germs even after washing.  Please DO NOT use if you have an allergy to CHG or antibacterial soaps.  If your skin becomes reddened/irritated stop using the CHG and inform your nurse when you arrive at Short Stay.  Do not shave (including legs and underarms) for at least 48 hours prior  to the first CHG shower.  You may shave your face.  Please follow these instructions carefully:   1.  Shower with CHG Soap the night before surgery and the                                morning of Surgery.  2.  If you choose to wash your hair, wash your hair first as usual with your       normal shampoo.  3.  After you shampoo, rinse your hair and body thoroughly to remove the                      Shampoo.  4.  Use CHG as you would any other liquid soap.  You can apply chg directly       to the skin and wash gently with scrungie or a clean washcloth.  5.  Apply the CHG Soap to your body ONLY FROM THE NECK DOWN.        Do not use on open wounds or open sores.  Avoid contact with your eyes, ears, mouth and genitals (private parts).  Wash genitals (private parts) with your normal soap.  6.  Wash thoroughly, paying special attention to the area where your surgery        will be performed.  7.  Thoroughly rinse your body with warm water from the neck down.  8.  DO NOT shower/wash with your normal soap after using and rinsing off       the CHG Soap.  9.  Pat yourself dry with a clean towel.            10.  Wear clean pajamas.            11.  Place clean sheets on your bed the night of your first shower and do not        sleep with pets.  Day of Surgery  Do not apply any lotions the morning of surgery.  Please wear clean clothes to the hospital.    Please read over the following fact sheets that you were given. Pain Booklet, Coughing and Deep Breathing, MRSA Information and Surgical Site Infection Prevention

## 2015-03-16 NOTE — Progress Notes (Signed)
Anesthesia Chart Review: Patient is a 81 year old male scheduled for microdiscectomy left L3-4 on 8/11/6 by Dr. Ronnald Ramp.  History includes non-smoker, post-operative N/V, DM2 (diet controlled), HLD, BPH s/p TURP, nephrolithiasis, deviated nasal septum, childhood malaria, renal cysts, gout, tonsillectomy. Hospitalization for ruptured hemorrhagic infected renal cyst 11/11/14. PCP is Dr. Crist Infante. Urologist is Dr. Karsten Ro.    Meds include allopurinol, amlodipine, ASA, Flonase, Allegra, HCTZ, lisinopril, Lopressor, fish oil, KCl, Viagra. Was on prednisone X 3 days, but last dose 03/14/15.  03/15/15 EKG: NSR, non-specific ST abnormality.  03/15/15 CXR: IMPRESSION: No evidence for acute abnormality.  Preoperative labs noted. K 3.0 (on KCl supplement). Cr 1.40, stable. Glucose 123. WBC 13.6 (just completed prednisone), H/H 14.6/43.1.  If no acute changes then I anticipate that he can proceed as planned.   [facility] Short Stay Center/Anesthesiology Phone [phone] 03/16/2015 2:03 PM

## 2015-03-21 ENCOUNTER — Encounter (HOSPITAL_COMMUNITY): Payer: Self-pay | Admitting: Anesthesiology

## 2015-03-21 MED ORDER — DEXAMETHASONE SODIUM PHOSPHATE 10 MG/ML IJ SOLN
10.0000 mg | INTRAMUSCULAR | Status: AC
  Administered 2015-03-22: 10 mg via INTRAVENOUS
  Filled 2015-03-21: qty 1

## 2015-03-21 MED ORDER — CEFAZOLIN SODIUM-DEXTROSE 2-3 GM-% IV SOLR
2.0000 g | INTRAVENOUS | Status: AC
  Administered 2015-03-22: 2 g via INTRAVENOUS
  Filled 2015-03-21: qty 50

## 2015-03-22 ENCOUNTER — Encounter (HOSPITAL_COMMUNITY)
Admission: RE | Disposition: A | Discharge status: Home / Self Care | Payer: Self-pay | Source: Ambulatory Visit | Attending: Neurological Surgery

## 2015-03-22 ENCOUNTER — Inpatient Hospital Stay (HOSPITAL_COMMUNITY): Payer: Medicare Other | Admitting: Vascular Surgery

## 2015-03-22 ENCOUNTER — Inpatient Hospital Stay (HOSPITAL_COMMUNITY): Payer: Medicare Other

## 2015-03-22 ENCOUNTER — Ambulatory Visit (HOSPITAL_COMMUNITY)
Admission: RE | Admit: 2015-03-22 | Discharge: 2015-03-22 | Disposition: A | Discharge status: Home / Self Care | Payer: Medicare Other | Source: Ambulatory Visit | Attending: Neurological Surgery | Admitting: Neurological Surgery

## 2015-03-22 ENCOUNTER — Inpatient Hospital Stay (HOSPITAL_COMMUNITY): Payer: Medicare Other | Admitting: Anesthesiology

## 2015-03-22 ENCOUNTER — Encounter (HOSPITAL_COMMUNITY): Payer: Self-pay | Admitting: Neurological Surgery

## 2015-03-22 DIAGNOSIS — M5126 Other intervertebral disc displacement, lumbar region: Principal | ICD-10-CM | POA: Insufficient documentation

## 2015-03-22 DIAGNOSIS — Z9889 Other specified postprocedural states: Secondary | ICD-10-CM

## 2015-03-22 DIAGNOSIS — Z7951 Long term (current) use of inhaled steroids: Secondary | ICD-10-CM | POA: Insufficient documentation

## 2015-03-22 DIAGNOSIS — E119 Type 2 diabetes mellitus without complications: Secondary | ICD-10-CM | POA: Insufficient documentation

## 2015-03-22 DIAGNOSIS — Z79899 Other long term (current) drug therapy: Secondary | ICD-10-CM | POA: Diagnosis not present

## 2015-03-22 DIAGNOSIS — M1990 Unspecified osteoarthritis, unspecified site: Secondary | ICD-10-CM | POA: Insufficient documentation

## 2015-03-22 DIAGNOSIS — I10 Essential (primary) hypertension: Secondary | ICD-10-CM | POA: Diagnosis not present

## 2015-03-22 DIAGNOSIS — N281 Cyst of kidney, acquired: Secondary | ICD-10-CM | POA: Insufficient documentation

## 2015-03-22 DIAGNOSIS — Z419 Encounter for procedure for purposes other than remedying health state, unspecified: Secondary | ICD-10-CM

## 2015-03-22 DIAGNOSIS — N400 Enlarged prostate without lower urinary tract symptoms: Secondary | ICD-10-CM | POA: Diagnosis not present

## 2015-03-22 DIAGNOSIS — Z7952 Long term (current) use of systemic steroids: Secondary | ICD-10-CM | POA: Diagnosis not present

## 2015-03-22 DIAGNOSIS — E785 Hyperlipidemia, unspecified: Secondary | ICD-10-CM | POA: Insufficient documentation

## 2015-03-22 DIAGNOSIS — M109 Gout, unspecified: Secondary | ICD-10-CM | POA: Insufficient documentation

## 2015-03-22 DIAGNOSIS — Z7982 Long term (current) use of aspirin: Secondary | ICD-10-CM | POA: Diagnosis not present

## 2015-03-22 HISTORY — PX: LUMBAR LAMINECTOMY/DECOMPRESSION MICRODISCECTOMY: SHX5026

## 2015-03-22 LAB — GLUCOSE, CAPILLARY
Glucose-Capillary: 155 mg/dL — ABNORMAL HIGH (ref 65–99)
Glucose-Capillary: 119 mg/dL — ABNORMAL HIGH (ref 65–99)

## 2015-03-22 SURGERY — LUMBAR LAMINECTOMY/DECOMPRESSION MICRODISCECTOMY 1 LEVEL
Anesthesia: General | Site: Spine Lumbar | Laterality: Left

## 2015-03-22 MED ORDER — CEFAZOLIN SODIUM 1-5 GM-% IV SOLN
1.0000 g | Freq: Three times a day (TID) | INTRAVENOUS | Status: DC
  Filled 2015-03-22 (×2): qty 50

## 2015-03-22 MED ORDER — PHENYLEPHRINE HCL 10 MG/ML IJ SOLN
INTRAMUSCULAR | Status: AC
  Filled 2015-03-22: qty 1

## 2015-03-22 MED ORDER — SODIUM CHLORIDE 0.9 % IR SOLN
Status: DC | PRN
  Administered 2015-03-22: 500 mL

## 2015-03-22 MED ORDER — POTASSIUM CHLORIDE CRYS ER 20 MEQ PO TBCR: 20.0000 meq | EXTENDED_RELEASE_TABLET | ORAL | Status: DC

## 2015-03-22 MED ORDER — PHENYLEPHRINE 40 MCG/ML (10ML) SYRINGE FOR IV PUSH (FOR BLOOD PRESSURE SUPPORT)
PREFILLED_SYRINGE | INTRAVENOUS | Status: AC
  Filled 2015-03-22: qty 10

## 2015-03-22 MED ORDER — LISINOPRIL 20 MG PO TABS
20.0000 mg | ORAL_TABLET | Freq: Every morning | ORAL | Status: DC
  Administered 2015-03-22: 20 mg via ORAL
  Filled 2015-03-22: qty 1

## 2015-03-22 MED ORDER — ROCURONIUM BROMIDE 50 MG/5ML IV SOLN
INTRAVENOUS | Status: AC
  Filled 2015-03-22: qty 1

## 2015-03-22 MED ORDER — ONDANSETRON HCL 4 MG/2ML IJ SOLN
INTRAMUSCULAR | Status: DC | PRN
  Administered 2015-03-22: 4 mg via INTRAVENOUS

## 2015-03-22 MED ORDER — HEMOSTATIC AGENTS (NO CHARGE) OPTIME
TOPICAL | Status: DC | PRN
  Administered 2015-03-22: 1 via TOPICAL

## 2015-03-22 MED ORDER — LIDOCAINE HCL (CARDIAC) 20 MG/ML IV SOLN
INTRAVENOUS | Status: DC | PRN
  Administered 2015-03-22: 50 mg via INTRAVENOUS

## 2015-03-22 MED ORDER — LACTATED RINGERS IV SOLN
INTRAVENOUS | Status: DC
  Administered 2015-03-22: 08:00:00 via INTRAVENOUS

## 2015-03-22 MED ORDER — EPHEDRINE SULFATE 50 MG/ML IJ SOLN
INTRAMUSCULAR | Status: AC
  Filled 2015-03-22: qty 1

## 2015-03-22 MED ORDER — FENTANYL CITRATE (PF) 250 MCG/5ML IJ SOLN
INTRAMUSCULAR | Status: AC
  Filled 2015-03-22: qty 5

## 2015-03-22 MED ORDER — SUCCINYLCHOLINE CHLORIDE 20 MG/ML IJ SOLN
INTRAMUSCULAR | Status: AC
  Filled 2015-03-22: qty 1

## 2015-03-22 MED ORDER — LIDOCAINE HCL (CARDIAC) 20 MG/ML IV SOLN
INTRAVENOUS | Status: AC
  Filled 2015-03-22: qty 10

## 2015-03-22 MED ORDER — ONDANSETRON HCL 4 MG/2ML IJ SOLN: 4.0000 mg | INTRAMUSCULAR | Status: DC | PRN

## 2015-03-22 MED ORDER — POTASSIUM CHLORIDE IN NACL 40-0.9 MEQ/L-% IV SOLN
INTRAVENOUS | Status: DC
  Filled 2015-03-22 (×2): qty 1000

## 2015-03-22 MED ORDER — 0.9 % SODIUM CHLORIDE (POUR BTL) OPTIME
TOPICAL | Status: DC | PRN
  Administered 2015-03-22: 1000 mL

## 2015-03-22 MED ORDER — ROCURONIUM BROMIDE 100 MG/10ML IV SOLN
INTRAVENOUS | Status: DC | PRN
  Administered 2015-03-22: 50 mg via INTRAVENOUS

## 2015-03-22 MED ORDER — SUGAMMADEX SODIUM 200 MG/2ML IV SOLN
INTRAVENOUS | Status: AC
  Filled 2015-03-22: qty 2

## 2015-03-22 MED ORDER — AMLODIPINE BESYLATE 10 MG PO TABS: 10.0000 mg | ORAL_TABLET | Freq: Every morning | ORAL | Status: DC

## 2015-03-22 MED ORDER — VECURONIUM BROMIDE 10 MG IV SOLR
INTRAVENOUS | Status: AC
  Filled 2015-03-22: qty 10

## 2015-03-22 MED ORDER — MIDAZOLAM HCL 2 MG/2ML IJ SOLN
INTRAMUSCULAR | Status: AC
  Filled 2015-03-22: qty 2

## 2015-03-22 MED ORDER — TRAMADOL HCL 50 MG PO TABS: 50.0000 mg | ORAL_TABLET | Freq: Four times a day (QID) | ORAL | Status: DC | PRN

## 2015-03-22 MED ORDER — ACETAMINOPHEN 500 MG PO TABS: 1000.0000 mg | ORAL_TABLET | Freq: Three times a day (TID) | ORAL | Status: DC | PRN

## 2015-03-22 MED ORDER — HYDROCHLOROTHIAZIDE 25 MG PO TABS
25.0000 mg | ORAL_TABLET | Freq: Every morning | ORAL | Status: DC
  Administered 2015-03-22: 25 mg via ORAL

## 2015-03-22 MED ORDER — GLYCOPYRROLATE 0.2 MG/ML IJ SOLN
INTRAMUSCULAR | Status: DC | PRN
  Administered 2015-03-22: 0.2 mg via INTRAVENOUS

## 2015-03-22 MED ORDER — MORPHINE SULFATE 2 MG/ML IJ SOLN: 1.0000 mg | INTRAMUSCULAR | Status: DC | PRN

## 2015-03-22 MED ORDER — PHENOL 1.4 % MT LIQD: 1.0000 | OROMUCOSAL | Status: DC | PRN

## 2015-03-22 MED ORDER — SODIUM CHLORIDE 0.9 % IJ SOLN: 3.0000 mL | INTRAMUSCULAR | Status: DC | PRN

## 2015-03-22 MED ORDER — ACETAMINOPHEN 650 MG RE SUPP: 650.0000 mg | RECTAL | Status: DC | PRN

## 2015-03-22 MED ORDER — FENTANYL CITRATE (PF) 100 MCG/2ML IJ SOLN
INTRAMUSCULAR | Status: DC | PRN
  Administered 2015-03-22: 100 ug via INTRAVENOUS
  Administered 2015-03-22 (×2): 50 ug via INTRAVENOUS

## 2015-03-22 MED ORDER — ADULT MULTIVITAMIN W/MINERALS CH: 1.0000 | ORAL_TABLET | Freq: Every morning | ORAL | Status: DC

## 2015-03-22 MED ORDER — DEXAMETHASONE 4 MG PO TABS
4.0000 mg | ORAL_TABLET | Freq: Four times a day (QID) | ORAL | Status: DC
  Administered 2015-03-22: 4 mg via ORAL

## 2015-03-22 MED ORDER — MENTHOL 3 MG MT LOZG: 1.0000 | LOZENGE | OROMUCOSAL | Status: DC | PRN

## 2015-03-22 MED ORDER — POTASSIUM CHLORIDE CRYS ER 20 MEQ PO TBCR
40.0000 meq | EXTENDED_RELEASE_TABLET | Freq: Every day | ORAL | Status: DC
  Administered 2015-03-22: 40 meq via ORAL
  Filled 2015-03-22: qty 2

## 2015-03-22 MED ORDER — PROPOFOL 10 MG/ML IV BOLUS
INTRAVENOUS | Status: DC | PRN
  Administered 2015-03-22: 100 mg via INTRAVENOUS

## 2015-03-22 MED ORDER — SUGAMMADEX SODIUM 200 MG/2ML IV SOLN
INTRAVENOUS | Status: DC | PRN
  Administered 2015-03-22: 170.6 mg via INTRAVENOUS

## 2015-03-22 MED ORDER — ACETAMINOPHEN 325 MG PO TABS: 650.0000 mg | ORAL_TABLET | ORAL | Status: DC | PRN

## 2015-03-22 MED ORDER — METOPROLOL TARTRATE 50 MG PO TABS
50.0000 mg | ORAL_TABLET | Freq: Two times a day (BID) | ORAL | Status: DC
  Filled 2015-03-22: qty 1

## 2015-03-22 MED ORDER — GLYCOPYRROLATE 0.2 MG/ML IJ SOLN
INTRAMUSCULAR | Status: AC
  Filled 2015-03-22: qty 1

## 2015-03-22 MED ORDER — THROMBIN 5000 UNITS EX SOLR
CUTANEOUS | Status: DC | PRN
  Administered 2015-03-22 (×2): 5000 [IU] via TOPICAL

## 2015-03-22 MED ORDER — THROMBIN 5000 UNITS EX SOLR
OROMUCOSAL | Status: DC | PRN
  Administered 2015-03-22: 5 mL via TOPICAL

## 2015-03-22 MED ORDER — BUPIVACAINE HCL (PF) 0.25 % IJ SOLN
INTRAMUSCULAR | Status: DC | PRN
  Administered 2015-03-22: 2 mL

## 2015-03-22 MED ORDER — POTASSIUM CHLORIDE IN NACL 20-0.9 MEQ/L-% IV SOLN: INTRAVENOUS | Status: DC

## 2015-03-22 MED ORDER — DOCUSATE SODIUM 100 MG PO CAPS: 100.0000 mg | ORAL_CAPSULE | Freq: Two times a day (BID) | ORAL | Status: DC

## 2015-03-22 MED ORDER — LACTATED RINGERS IV SOLN
INTRAVENOUS | Status: DC | PRN
  Administered 2015-03-22: 09:00:00 via INTRAVENOUS

## 2015-03-22 MED ORDER — ALLOPURINOL 150 MG HALF TABLET: 150.0000 mg | ORAL_TABLET | Freq: Every day | ORAL | Status: DC

## 2015-03-22 MED ORDER — SODIUM CHLORIDE 0.9 % IJ SOLN
3.0000 mL | Freq: Two times a day (BID) | INTRAMUSCULAR | Status: DC
  Administered 2015-03-22: 3 mL via INTRAVENOUS

## 2015-03-22 MED ORDER — DEXAMETHASONE SODIUM PHOSPHATE 4 MG/ML IJ SOLN: 4.0000 mg | Freq: Four times a day (QID) | INTRAMUSCULAR | Status: DC

## 2015-03-22 SURGICAL SUPPLY — 45 items
APL SKNCLS STERI-STRIP NONHPOA (GAUZE/BANDAGES/DRESSINGS) ×1
BAG DECANTER FOR FLEXI CONT (MISCELLANEOUS) ×3 IMPLANT
BENZOIN TINCTURE PRP APPL 2/3 (GAUZE/BANDAGES/DRESSINGS) ×3 IMPLANT
BUR MATCHSTICK NEURO 3.0 LAGG (BURR) ×3 IMPLANT
CANISTER SUCT 3000ML PPV (MISCELLANEOUS) ×3 IMPLANT
CLOSURE WOUND 1/2 X4 (GAUZE/BANDAGES/DRESSINGS) ×1
DRAPE LAPAROTOMY 100X72X124 (DRAPES) ×3 IMPLANT
DRAPE MICROSCOPE LEICA (MISCELLANEOUS) ×3 IMPLANT
DRAPE POUCH INSTRU U-SHP 10X18 (DRAPES) ×3 IMPLANT
DRAPE SURG 17X23 STRL (DRAPES) ×3 IMPLANT
DRSG OPSITE POSTOP 3X4 (GAUZE/BANDAGES/DRESSINGS) ×2 IMPLANT
DURAPREP 26ML APPLICATOR (WOUND CARE) ×3 IMPLANT
ELECT REM PT RETURN 9FT ADLT (ELECTROSURGICAL) ×3
ELECTRODE REM PT RTRN 9FT ADLT (ELECTROSURGICAL) ×1 IMPLANT
GAUZE SPONGE 4X4 16PLY XRAY LF (GAUZE/BANDAGES/DRESSINGS) IMPLANT
GLOVE BIO SURGEON STRL SZ8 (GLOVE) ×3 IMPLANT
GLOVE BIOGEL PI IND STRL 7.5 (GLOVE) IMPLANT
GLOVE BIOGEL PI INDICATOR 7.5 (GLOVE) ×2
GLOVE SURG SS PI 7.5 STRL IVOR (GLOVE) ×4 IMPLANT
GOWN STRL REUS W/ TWL LRG LVL3 (GOWN DISPOSABLE) IMPLANT
GOWN STRL REUS W/ TWL XL LVL3 (GOWN DISPOSABLE) ×1 IMPLANT
GOWN STRL REUS W/TWL 2XL LVL3 (GOWN DISPOSABLE) IMPLANT
GOWN STRL REUS W/TWL LRG LVL3 (GOWN DISPOSABLE) ×3
GOWN STRL REUS W/TWL XL LVL3 (GOWN DISPOSABLE) ×3
HEMOSTAT POWDER KIT SURGIFOAM (HEMOSTASIS) ×2 IMPLANT
KIT BASIN OR (CUSTOM PROCEDURE TRAY) ×3 IMPLANT
KIT ROOM TURNOVER OR (KITS) ×3 IMPLANT
NDL HYPO 25X1 1.5 SAFETY (NEEDLE) ×1 IMPLANT
NDL SPNL 20GX3.5 QUINCKE YW (NEEDLE) IMPLANT
NEEDLE HYPO 25X1 1.5 SAFETY (NEEDLE) ×3 IMPLANT
NEEDLE SPNL 20GX3.5 QUINCKE YW (NEEDLE) ×3 IMPLANT
NS IRRIG 1000ML POUR BTL (IV SOLUTION) ×3 IMPLANT
PACK LAMINECTOMY NEURO (CUSTOM PROCEDURE TRAY) ×3 IMPLANT
PAD ARMBOARD 7.5X6 YLW CONV (MISCELLANEOUS) ×13 IMPLANT
RUBBERBAND STERILE (MISCELLANEOUS) ×6 IMPLANT
SPONGE SURGIFOAM ABS GEL SZ50 (HEMOSTASIS) ×3 IMPLANT
STRIP CLOSURE SKIN 1/2X4 (GAUZE/BANDAGES/DRESSINGS) ×2 IMPLANT
SUT VIC AB 0 CT1 18XCR BRD8 (SUTURE) ×1 IMPLANT
SUT VIC AB 0 CT1 8-18 (SUTURE) ×3
SUT VIC AB 2-0 CP2 18 (SUTURE) ×3 IMPLANT
SUT VIC AB 3-0 SH 8-18 (SUTURE) ×5 IMPLANT
SYR 20ML ECCENTRIC (SYRINGE) ×1 IMPLANT
TOWEL OR 17X24 6PK STRL BLUE (TOWEL DISPOSABLE) ×3 IMPLANT
TOWEL OR 17X26 10 PK STRL BLUE (TOWEL DISPOSABLE) ×3 IMPLANT
WATER STERILE IRR 1000ML POUR (IV SOLUTION) ×3 IMPLANT

## 2015-03-22 NOTE — Addendum Note (Signed)
Addendum  created 03/22/15 2110 by Eligha Bridegroom, CRNA   Modules edited: Anesthesia Events, Narrator   Narrator:  Narrator: Event Log Edited

## 2015-03-22 NOTE — Anesthesia Postprocedure Evaluation (Signed)
  Anesthesia Post-op Note  Patient: ROMAINE NEVILLE  Procedure(s) Performed: Procedure(s) with comments: Microdiscectomy - Lumbar three,Lumbar four left (Left) - left  Patient Location: PACU  Anesthesia Type:General  Level of Consciousness: awake, alert , oriented and patient cooperative  Airway and Oxygen Therapy: Patient Spontanous Breathing  Post-op Pain: none  Post-op Assessment: Post-op Vital signs reviewed, Patient's Cardiovascular Status Stable, Respiratory Function Stable, Patent Airway, No signs of Nausea or vomiting and Pain level controlled LLE Motor Response: Purposeful movement, Responds to commands LLE Sensation: No numbness RLE Motor Response: Purposeful movement, Responds to commands RLE Sensation: No numbness      Post-op Vital Signs: Reviewed and stable  Last Vitals:  Filed Vitals:   03/22/15 1350  BP: 160/80  Pulse: 77  Temp: 36.5 C  Resp: 22    Complications: No apparent anesthesia complications

## 2015-03-22 NOTE — H&P (Signed)
Subjective: Patient is a 81 y.o. male admitted for leg pain. Onset of symptoms was several months ago, gradually worsening since that time.  The pain is rated severe, and is located at the across the lower back and radiates to LLE. The pain is described as aching and occurs all day. The symptoms have been progressive. Symptoms are exacerbated by exercise. MRI or CT showed HNP.   Past Medical History  Diagnosis Date  . Hypertension   . Gout   . Diverticulosis   . Diabetes mellitus without complication     Diet controlled, last A1c 5.9%  . Hyperlipidemia   . Bilateral renal cysts   . Left inguinal hernia   . BPH (benign prostatic hyperplasia)   . Kidney stone   . Arthritis   . Cancer     skin cancer  . H/O malaria     as a child  . PONV (postoperative nausea and vomiting)     with the TURP, none since  . Deviated nasal septum     Past Surgical History  Procedure Laterality Date  . Appendectomy    . Transurethral resection of prostate  1988  . Retinal tear repair cryotherapy  2006  . Elbow surgery Right   . Carpal tunnel release Right   . Mohs surgery Right     ear  . Hernia repair      inguinal  . Colonoscopy    . Tonsillectomy      and adenoids  . Eye surgery Bilateral     cataract surgery with lens implant    Prior to Admission medications   Medication Sig Start Date End Date Taking? Authorizing Provider  acetaminophen (TYLENOL) 500 MG tablet Take 1,000 mg by mouth every 8 (eight) hours as needed (pain).   Yes Historical Provider, MD  allopurinol (ZYLOPRIM) 300 MG tablet Take 150 mg by mouth at bedtime.   Yes Historical Provider, MD  amLODipine (NORVASC) 10 MG tablet Take 10 mg by mouth every morning.   Yes Historical Provider, MD  aspirin 81 MG tablet Take 81 mg by mouth daily.   Yes Historical Provider, MD  docusate sodium (COLACE) 100 MG capsule Take 4 capsules (400 mg total) by mouth daily. 11/13/14  Yes Christina P Rama, MD  fexofenadine (ALLEGRA) 180 MG tablet Take  90 mg by mouth every morning.   Yes Historical Provider, MD  fluticasone (FLONASE) 50 MCG/ACT nasal spray Place 2 sprays into both nostrils at bedtime.   Yes Historical Provider, MD  hydrochlorothiazide (HYDRODIURIL) 25 MG tablet Take 25 mg by mouth every morning.   Yes Historical Provider, MD  lisinopril (PRINIVIL,ZESTRIL) 20 MG tablet Take 20 mg by mouth every morning.   Yes Historical Provider, MD  Methylcellulose, Laxative, 500 MG TABS Take 500 mg by mouth daily at 12 noon.    Yes Historical Provider, MD  metoprolol (LOPRESSOR) 50 MG tablet Take 50 mg by mouth 2 (two) times daily.   Yes Historical Provider, MD  Multiple Vitamin (MULTIVITAMIN WITH MINERALS) TABS tablet Take 1 tablet by mouth every morning.   Yes Historical Provider, MD  Omega-3 Fatty Acids (FISH OIL) 1000 MG CAPS Take 1,000 mg by mouth 3 (three) times daily.   Yes Historical Provider, MD  potassium chloride SA (K-DUR,KLOR-CON) 20 MEQ tablet Take 20 mEq by mouth as directed. Alternating BID and TID   Yes Historical Provider, MD  predniSONE (DELTASONE) 20 MG tablet TAKE 1 TABLET 3 TIMES A DAY FOR 5 DAYS 03/09/15  Yes  Historical Provider, MD  simvastatin (ZOCOR) 40 MG tablet Take 20 mg by mouth at bedtime.   Yes Historical Provider, MD  VIAGRA 100 MG tablet Take 100 mg by mouth as needed for erectile dysfunction.  09/29/14  Yes Historical Provider, MD  ciprofloxacin (CIPRO) 500 MG tablet Take 1 tablet (500 mg total) by mouth 2 (two) times daily. Patient not taking: Reported on 02/23/2015 11/13/14   Venetia Maxon Rama, MD   No Known Allergies  Social History  Substance Use Topics  . Smoking status: Never Smoker   . Smokeless tobacco: Never Used  . Alcohol Use: 1.2 oz/week    2 Glasses of wine, 0 Standard drinks or equivalent per week     Comment: 1 beer or a glass of week per week    Family History  Problem Relation Age of Onset  . Heart attack Father     Died at age 36  . Ulcers Mother   . Rheum arthritis Mother      Review  of Systems  Positive ROS: neg  All other systems have been reviewed and were otherwise negative with the exception of those mentioned in the HPI and as above.  Objective: Vital signs in last 24 hours:    General Appearance: Alert, cooperative, no distress, appears stated age Head: Normocephalic, without obvious abnormality, atraumatic Eyes: PERRL, conjunctiva/corneas clear, EOM's intact    Neck: Supple, symmetrical, trachea midline Back: Symmetric, no curvature, ROM normal, no CVA tenderness Lungs:  respirations unlabored Heart: Regular rate and rhythm Abdomen: Soft, non-tender Extremities: Extremities normal, atraumatic, no cyanosis or edema Pulses: 2+ and symmetric all extremities Skin: Skin color, texture, turgor normal, no rashes or lesions  NEUROLOGIC:   Mental status: Alert and oriented x4,  no aphasia, good attention span, fund of knowledge, and memory Motor Exam - grossly normal Sensory Exam - grossly normal Reflexes: 1+ Coordination - grossly normal Gait - grossly normal Balance - grossly normal Cranial Nerves: I: smell Not tested  II: visual acuity  OS: nl    OD: nl  II: visual fields Full to confrontation  II: pupils Equal, round, reactive to light  III,VII: ptosis None  III,IV,VI: extraocular muscles  Full ROM  V: mastication Normal  V: facial light touch sensation  Normal  V,VII: corneal reflex  Present  VII: facial muscle function - upper  Normal  VII: facial muscle function - lower Normal  VIII: hearing Not tested  IX: soft palate elevation  Normal  IX,X: gag reflex Present  XI: trapezius strength  5/5  XI: sternocleidomastoid strength 5/5  XI: neck flexion strength  5/5  XII: tongue strength  Normal    Data Review Lab Results  Component Value Date   WBC 13.6* 03/15/2015   HGB 14.6 03/15/2015   HCT 43.1 03/15/2015   MCV 86.2 03/15/2015   PLT 186 03/15/2015   Lab Results  Component Value Date   NA 139 03/15/2015   K 3.0* 03/15/2015   CL  100* 03/15/2015   CO2 30 03/15/2015   BUN 39* 03/15/2015   CREATININE 1.40* 03/15/2015   GLUCOSE 123* 03/15/2015   Lab Results  Component Value Date   INR 1.12 03/15/2015    Assessment/Plan: Patient admitted for L L3-4 diskectomy. Patient has failed a reasonable attempt at conservative therapy.  I explained the condition and procedure to the patient and answered any questions.  Patient wishes to proceed with procedure as planned. Understands risks/ benefits and typical outcomes of procedure.   JONES,DAVID  S 03/22/2015 7:35 AM

## 2015-03-22 NOTE — Progress Notes (Signed)
Called Dr.Turk for sign out

## 2015-03-22 NOTE — Progress Notes (Signed)
Patient alert and oriented, mae's well, voiding adequate amount of urine, swallowing without difficulty, no c/o pain. Patient discharged home with family. Script and discharged instructions given to patient. Patient and family stated understanding of d/c instructions given and has an appointment with MD. 

## 2015-03-22 NOTE — Progress Notes (Addendum)
Pt stated he felt he was "breaking out in a cold sweat" after IV start.  Head of bed lowered and cool wash cloth applied.  BP 101/55. Pt feeling better immediately.  BP now 123/75.  Pt feeling fine

## 2015-03-22 NOTE — Transfer of Care (Signed)
Immediate Anesthesia Transfer of Care Note  Patient: Erik Price  Procedure(s) Performed: Procedure(s) with comments: Microdiscectomy - Lumbar three,Lumbar four left (Left) - left  Patient Location: PACU  Anesthesia Type:General  Level of Consciousness: awake, alert  and oriented  Airway & Oxygen Therapy: Patient Spontanous Breathing and Patient connected to nasal cannula oxygen  Post-op Assessment: Report given to RN and Post -op Vital signs reviewed and stable  Post vital signs: Reviewed and stable  Last Vitals:  Filed Vitals:   03/22/15 0754  BP: 164/103  Pulse: 57  Temp: 36.9 C  Resp: 16    Complications: No apparent anesthesia complications

## 2015-03-22 NOTE — Op Note (Signed)
03/22/2015  10:26 AM  PATIENT:  Erik Price  81 y.o. male  PRE-OPERATIVE DIAGNOSIS:  Left L3-4 disc herniation  POST-OPERATIVE DIAGNOSIS:  Same  PROCEDURE:  Left L3-4 hemilaminectomy, medial facetectomy and foraminotomy followed by discectomy L3-4 on the left  SURGEON:  Sherley Bounds, MD  ASSISTANTS: None  ANESTHESIA:   General  EBL: 50 ml     BLOOD ADMINISTERED:none  DRAINS: None   SPECIMEN:  No Specimen  INDICATION FOR PROCEDURE: This patient presented with severe left leg pain. MRI showed an inferior free fragment at L3-4 on the left compressing the left L4 nerve root. He tried medical management without relief. I recommended microdiscectomy. Patient understood the risks, benefits, and alternatives and potential outcomes and wished to proceed.  PROCEDURE DETAILS: The patient was taken to the operating room and after induction of adequate generalized endotracheal anesthesia, the patient was rolled into the prone position on the Wilson frame and all pressure points were padded. The lumbar region was cleaned and then prepped with DuraPrep and draped in the usual sterile fashion. 5 cc of local anesthesia was injected and then a dorsal midline incision was made and carried down to the lumbo sacral fascia. The fascia was opened and the paraspinous musculature was taken down in a subperiosteal fashion to expose L3-4 on the left. Intraoperative x-ray confirmed my level, and then I used a combination of the high-speed drill and the Kerrison punches to perform a hemilaminectomy, medial facetectomy, and foraminotomy at L3-4 on the left. The underlying yellow ligament was opened and removed in a piecemeal fashion to expose the underlying dura and exiting nerve root. I undercut the lateral recess and dissected down until I was medial to and distal to the pedicle. The nerve root was well decompressed. We then gently retracted the nerve root medially with a retractor, coagulated the epidural  venous vasculature, and inspected the disc space. There is a subannular bulge. There was an inferior free fragment under the L4 nerve root. I removed several pieces with a nerve hook and pituitary rongeur and suction. I worked in both the axilla and the shoulder of the nerve root. I then palpated with a coronary dilator along the nerve root and into the foramen to assure adequate decompression. I felt no more compression of the nerve root. I irrigated with saline solution containing bacitracin. Achieved hemostasis with bipolar cautery, lined the dura with Gelfoam, and then closed the fascia with 0 Vicryl. I closed the subcutaneous tissues with 2-0 Vicryl and the subcuticular tissues with 3-0 Vicryl. The skin was then closed with benzoin and Steri-Strips. The drapes were removed, a sterile dressing was applied. The patient was awakened from general anesthesia and transferred to the recovery room in stable condition. At the end of the procedure all sponge, needle and instrument counts were correct.   PLAN OF CARE: Admit for overnight observation  PATIENT DISPOSITION:  PACU - hemodynamically stable.   Delay start of Pharmacological VTE agent (>24hrs) due to surgical blood loss or risk of bleeding:  yes

## 2015-03-22 NOTE — Plan of Care (Signed)
Problem: Consults Goal: Diagnosis - Spinal Surgery Outcome: Completed/Met Date Met:  03/22/15 Microdiscectomy

## 2015-03-22 NOTE — Anesthesia Preprocedure Evaluation (Signed)
Anesthesia Evaluation  Patient identified by MRN, date of birth, ID band Patient awake    Reviewed: Allergy & Precautions, NPO status , Patient's Chart, lab work & pertinent test results  History of Anesthesia Complications (+) PONV  Airway Mallampati: II  TM Distance: >3 FB Neck ROM: Full    Dental  (+) Dental Advisory Given,    Pulmonary neg pulmonary ROS,  breath sounds clear to auscultation  Pulmonary exam normal       Cardiovascular hypertension, Pt. on medications and Pt. on home beta blockers negative cardio ROS Normal cardiovascular examRhythm:Regular Rate:Normal     Neuro/Psych negative neurological ROS  negative psych ROS   GI/Hepatic negative GI ROS, Neg liver ROS,   Endo/Other  negative endocrine ROSdiabetes, Well ControlledBorderline diabetic, diet controlled  Renal/GU Renal diseasenegative Renal ROSRenal cysts     Musculoskeletal negative musculoskeletal ROS (+) Arthritis -, Osteoarthritis,    Abdominal   Peds  Hematology negative hematology ROS (+)   Anesthesia Other Findings Day of surgery medications reviewed with the patient. Took Metoprolol this AM  Reproductive/Obstetrics                             Anesthesia Physical Anesthesia Plan  ASA: II  Anesthesia Plan: General   Post-op Pain Management:    Induction: Intravenous  Airway Management Planned: Oral ETT  Additional Equipment:   Intra-op Plan:   Post-operative Plan: Extubation in OR  Informed Consent: I have reviewed the patients History and Physical, chart, labs and discussed the procedure including the risks, benefits and alternatives for the proposed anesthesia with the patient or authorized representative who has indicated his/her understanding and acceptance.   Dental advisory given  Plan Discussed with: CRNA and Anesthesiologist  Anesthesia Plan Comments: (Risks, benefits, alternatives  discussed with patient.  All questions answered.  Patient wishes to proceed.)        Anesthesia Quick Evaluation

## 2015-03-23 ENCOUNTER — Encounter (HOSPITAL_COMMUNITY): Payer: Self-pay | Admitting: Neurological Surgery

## 2015-03-23 LAB — POCT I-STAT 4, (NA,K, GLUC, HGB,HCT)
GLUCOSE: 133 mg/dL — AB (ref 65–99)
HCT: 39 % (ref 39.0–52.0)
Hemoglobin: 13.3 g/dL (ref 13.0–17.0)
Potassium: 3.1 mmol/L — ABNORMAL LOW (ref 3.5–5.1)
Sodium: 139 mmol/L (ref 135–145)

## 2015-03-23 NOTE — Discharge Summary (Signed)
Physician Discharge Summary  Patient ID: Erik Price MRN: [id number] DOB/AGE: 04-28-1931 81 y.o.  Admit date: 03/22/2015 Discharge date: 03/23/2015  Admission Diagnoses: HNP lumbar    Discharge Diagnoses: same   Discharged Condition: good  Hospital Course: The patient was admitted on 03/22/2015 and taken to the operating room where the patient underwent microdiskectomy. The patient tolerated the procedure well and was taken to the recovery room and then to the floor in stable condition. The hospital course was routine. There were no complications. The wound remained clean dry and intact. Pt had appropriate back soreness. No complaints of leg pain or new N/T/W. The patient remained afebrile with stable vital signs, and tolerated a regular diet. The patient continued to increase activities, and pain was well controlled with oral pain medications.   Consults: None  Significant Diagnostic Studies:  Results for orders placed or performed during the hospital encounter of 03/22/15  Glucose, capillary  Result Value Ref Range   Glucose-Capillary 155 (H) 65 - 99 mg/dL  Glucose, capillary  Result Value Ref Range   Glucose-Capillary 119 (H) 65 - 99 mg/dL   Comment 1 Notify RN    Comment 2 Document in Chart   I-STAT 4, (NA,K, GLUC, HGB,HCT)  Result Value Ref Range   Sodium 139 135 - 145 mmol/L   Potassium 3.1 (L) 3.5 - 5.1 mmol/L   Glucose, Bld 133 (H) 65 - 99 mg/dL   HCT 39.0 39.0 - 52.0 %   Hemoglobin 13.3 13.0 - 17.0 g/dL    Chest 2 View  03/15/2015   CLINICAL DATA:  Pre-admission. Surgery scheduled for 03/22/2015. L3-4 microdiscectomy. History of hypertension, pre diabetes.  EXAM: CHEST  2 VIEW  COMPARISON:  None.  FINDINGS: The heart size and mediastinal contours are within normal limits. Both lungs are clear. Moderate thoracic spondylosis present.  IMPRESSION: No evidence for acute  abnormality.   Electronically Signed   By: Nolon Nations M.D.   On: 03/15/2015 16:59   Dg Lumbar  Spine 2-3 Views  03/22/2015   CLINICAL DATA:  Left L3-4 microdiskectomy  EXAM: LUMBAR SPINE - 2-3 VIEW  COMPARISON:  03/09/2015  FINDINGS: Two lateral spot films were obtained intraoperatively. The initial image demonstrates a needle in the posterior soft tissues at the L3-4 interspace. Mild retrolisthesis of L3 with respect L4 is noted as well as significant grade 1 anterolisthesis of L5 on S1. These changes are stable from the recent MRI.  Second film shows surgical instrument overlying the posterior elements at the L3-4 articulation.  IMPRESSION: Intraoperative localization at L3-4.   Electronically Signed   By: Inez Catalina M.D.   On: 03/22/2015 11:27   Ct Aspiration  02/28/2015   INDICATION: Patient with history of enlarging hemorrhagic right-sided parapelvic cyst, now resulting in mild obstructive right-sided uropathy. Please perform CT-guided renal cyst aspiration for therapeutic and diagnostic purposes.  EXAM: CT-GUIDED ASPIRATION OF DOMINANT RIGHT-SIDED PARAPELVIC CYST  COMPARISON:  CT abdomen pelvis - 11/11/2014; abdominal MRI - 11/12/2014; renal ultrasound - 02/14/2015  MEDICATIONS: None  ANESTHESIA/SEDATION: Fentanyl 50 mcg IV; Versed 1.5 mg IV  Sedation time  12 minutes  CONTRAST:  None  COMPLICATIONS: None immediate  PROCEDURE: Informed consent was obtained from the patient following an explanation of the procedure, risks, benefits and alternatives. A time out was performed prior to the initiation of the procedure.  The patient was positioned supine on the CT table and a limited CT was performed for procedural planning demonstrating unchanged size and appearance of  the dominant approximately 8.1 x 7.7 cm right-sided parapelvic cyst (image 21, series 3 close. The procedure was planned. The operative site was prepped and draped in the usual sterile fashion. Appropriate trajectory was confirmed with a 22 gauge spinal needle after the adjacent tissues were anesthetized with 1% Lidocaine with  epinephrine.  Under intermittent CT guidance, an 18 gauge trocar needle was advanced into the central aspect of the cyst. An Amplatz wire was coiled within the renal cyst and appropriate positioning was confirmed with intermittent CT fluoroscopy.  The 18 gauge trocar needle was then exchanged for a 15 cm Yueh sheath catheter which was utilized to aspirate approximately 200 cc of dark brown fluid from the renal cyst. All aspirated fluid was capped and sent to the laboratory for cytologic analysis.  Limited postprocedural scanning demonstrated significant reduction in size of the renal cyst with residual caudal component measuring approximately 4.6 x 1.9 cm. No evidence of complication. A dressing was placed. The patient tolerated the procedure well without immediate postprocedural complication.  IMPRESSION: Technically successful CT guided right-sided renal cyst aspiration yielding 200 cc of dark brown fluid. All aspirated fluid was capped and sent to the laboratory for cytologic analysis.   Electronically Signed   By: Sandi Mariscal M.D.   On: 02/28/2015 17:05    Antibiotics:  Anti-infectives    Start     Dose/Rate Route Frequency Ordered Stop   03/22/15 1730  ceFAZolin (ANCEF) IVPB 1 g/50 mL premix  Status:  Discontinued     1 g 100 mL/hr over 30 Minutes Intravenous Every 8 hours 03/22/15 1350 03/22/15 2133   03/22/15 1009  bacitracin 50,000 Units in sodium chloride irrigation 0.9 % 500 mL irrigation  Status:  Discontinued       As needed 03/22/15 1010 03/22/15 1038   03/22/15 0900  ceFAZolin (ANCEF) IVPB 2 g/50 mL premix     2 g 100 mL/hr over 30 Minutes Intravenous To [facility] 03/21/15 0950 03/22/15 0935      Discharge Exam: Blood pressure 143/70, pulse 63, temperature 98.3 F (36.8 C), temperature source Oral, resp. rate 20, weight 188 lb (85.276 kg), SpO2 97 %. Neurologic: Grossly normal incision CDI  Discharge Medications:     Medication List    ASK your doctor about these  medications        acetaminophen 500 MG tablet  Commonly known as:  TYLENOL  Take 1,000 mg by mouth every 8 (eight) hours as needed (pain).     allopurinol 300 MG tablet  Commonly known as:  ZYLOPRIM  Take 150 mg by mouth at bedtime.     amLODipine 10 MG tablet  Commonly known as:  NORVASC  Take 10 mg by mouth every morning.     aspirin 81 MG tablet  Take 81 mg by mouth daily.     ciprofloxacin 500 MG tablet  Commonly known as:  CIPRO  Take 1 tablet (500 mg total) by mouth 2 (two) times daily.     docusate sodium 100 MG capsule  Commonly known as:  COLACE  Take 4 capsules (400 mg total) by mouth daily.     fexofenadine 180 MG tablet  Commonly known as:  ALLEGRA  Take 90 mg by mouth every morning.     Fish Oil 1000 MG Caps  Take 1,000 mg by mouth 3 (three) times daily.     fluticasone 50 MCG/ACT nasal spray  Commonly known as:  FLONASE  Place 2 sprays into both nostrils at  bedtime.     hydrochlorothiazide 25 MG tablet  Commonly known as:  HYDRODIURIL  Take 25 mg by mouth every morning.     lisinopril 20 MG tablet  Commonly known as:  PRINIVIL,ZESTRIL  Take 20 mg by mouth every morning.     Methylcellulose (Laxative) 500 MG Tabs  Take 500 mg by mouth daily at 12 noon.     metoprolol 50 MG tablet  Commonly known as:  LOPRESSOR  Take 50 mg by mouth 2 (two) times daily.     multivitamin with minerals Tabs tablet  Take 1 tablet by mouth every morning.     potassium chloride SA 20 MEQ tablet  Commonly known as:  K-DUR,KLOR-CON  Take 20 mEq by mouth as directed. Alternating BID and TID     predniSONE 20 MG tablet  Commonly known as:  DELTASONE  TAKE 1 TABLET 3 TIMES A DAY FOR 5 DAYS     simvastatin 40 MG tablet  Commonly known as:  ZOCOR  Take 20 mg by mouth at bedtime.     traMADol 50 MG tablet  Commonly known as:  ULTRAM  Take 50 mg by mouth every 6 (six) hours as needed for moderate pain.     VIAGRA 100 MG tablet  Generic drug:  sildenafil  Take 100  mg by mouth as needed for erectile dysfunction.        Disposition: home   Final Dx: microdiskectomy       Signed: Saraya Tirey S 03/23/2015, 12:13 PM

## 2015-03-27 DIAGNOSIS — N281 Cyst of kidney, acquired: Secondary | ICD-10-CM | POA: Diagnosis not present

## 2015-04-28 DIAGNOSIS — Z23 Encounter for immunization: Secondary | ICD-10-CM | POA: Diagnosis not present

## 2015-05-23 DIAGNOSIS — N1330 Unspecified hydronephrosis: Secondary | ICD-10-CM | POA: Diagnosis not present

## 2015-05-23 DIAGNOSIS — N281 Cyst of kidney, acquired: Secondary | ICD-10-CM | POA: Diagnosis not present

## 2015-06-07 DIAGNOSIS — E785 Hyperlipidemia, unspecified: Secondary | ICD-10-CM | POA: Diagnosis not present

## 2015-06-07 DIAGNOSIS — I10 Essential (primary) hypertension: Secondary | ICD-10-CM | POA: Diagnosis not present

## 2015-06-07 DIAGNOSIS — E119 Type 2 diabetes mellitus without complications: Secondary | ICD-10-CM | POA: Diagnosis not present

## 2015-06-07 DIAGNOSIS — Z125 Encounter for screening for malignant neoplasm of prostate: Secondary | ICD-10-CM | POA: Diagnosis not present

## 2015-06-07 DIAGNOSIS — R8299 Other abnormal findings in urine: Secondary | ICD-10-CM | POA: Diagnosis not present

## 2015-06-07 DIAGNOSIS — M109 Gout, unspecified: Secondary | ICD-10-CM | POA: Diagnosis not present

## 2015-06-14 DIAGNOSIS — Z6826 Body mass index (BMI) 26.0-26.9, adult: Secondary | ICD-10-CM | POA: Diagnosis not present

## 2015-06-14 DIAGNOSIS — E876 Hypokalemia: Secondary | ICD-10-CM | POA: Diagnosis not present

## 2015-06-14 DIAGNOSIS — Z1389 Encounter for screening for other disorder: Secondary | ICD-10-CM | POA: Diagnosis not present

## 2015-06-14 DIAGNOSIS — E119 Type 2 diabetes mellitus without complications: Secondary | ICD-10-CM | POA: Diagnosis not present

## 2015-06-14 DIAGNOSIS — M479 Spondylosis, unspecified: Secondary | ICD-10-CM | POA: Diagnosis not present

## 2015-06-14 DIAGNOSIS — M109 Gout, unspecified: Secondary | ICD-10-CM | POA: Diagnosis not present

## 2015-06-14 DIAGNOSIS — Z0000 Encounter for general adult medical examination without abnormal findings: Secondary | ICD-10-CM | POA: Diagnosis not present

## 2015-06-14 DIAGNOSIS — N1330 Unspecified hydronephrosis: Secondary | ICD-10-CM | POA: Diagnosis not present

## 2015-06-14 DIAGNOSIS — M4810 Ankylosing hyperostosis [Forestier], site unspecified: Secondary | ICD-10-CM | POA: Diagnosis not present

## 2015-06-14 DIAGNOSIS — N281 Cyst of kidney, acquired: Secondary | ICD-10-CM | POA: Diagnosis not present

## 2015-06-14 DIAGNOSIS — M545 Low back pain: Secondary | ICD-10-CM | POA: Diagnosis not present

## 2015-06-14 DIAGNOSIS — K5900 Constipation, unspecified: Secondary | ICD-10-CM | POA: Diagnosis not present

## 2015-06-26 DIAGNOSIS — D2362 Other benign neoplasm of skin of left upper limb, including shoulder: Secondary | ICD-10-CM | POA: Diagnosis not present

## 2015-06-26 DIAGNOSIS — D1801 Hemangioma of skin and subcutaneous tissue: Secondary | ICD-10-CM | POA: Diagnosis not present

## 2015-06-26 DIAGNOSIS — D2271 Melanocytic nevi of right lower limb, including hip: Secondary | ICD-10-CM | POA: Diagnosis not present

## 2015-06-26 DIAGNOSIS — L821 Other seborrheic keratosis: Secondary | ICD-10-CM | POA: Diagnosis not present

## 2015-06-26 DIAGNOSIS — D2272 Melanocytic nevi of left lower limb, including hip: Secondary | ICD-10-CM | POA: Diagnosis not present

## 2015-06-26 DIAGNOSIS — L570 Actinic keratosis: Secondary | ICD-10-CM | POA: Diagnosis not present

## 2015-06-26 DIAGNOSIS — D485 Neoplasm of uncertain behavior of skin: Secondary | ICD-10-CM | POA: Diagnosis not present

## 2015-06-26 DIAGNOSIS — Z85828 Personal history of other malignant neoplasm of skin: Secondary | ICD-10-CM | POA: Diagnosis not present

## 2015-06-26 DIAGNOSIS — D220 Melanocytic nevi of lip: Secondary | ICD-10-CM | POA: Diagnosis not present

## 2015-06-26 DIAGNOSIS — D225 Melanocytic nevi of trunk: Secondary | ICD-10-CM | POA: Diagnosis not present

## 2015-07-09 DIAGNOSIS — H6121 Impacted cerumen, right ear: Secondary | ICD-10-CM | POA: Diagnosis not present

## 2015-07-09 DIAGNOSIS — H9041 Sensorineural hearing loss, unilateral, right ear, with unrestricted hearing on the contralateral side: Secondary | ICD-10-CM | POA: Diagnosis not present

## 2015-07-09 DIAGNOSIS — Z974 Presence of external hearing-aid: Secondary | ICD-10-CM | POA: Diagnosis not present

## 2015-07-18 DIAGNOSIS — E876 Hypokalemia: Secondary | ICD-10-CM | POA: Diagnosis not present

## 2015-07-23 DIAGNOSIS — H26493 Other secondary cataract, bilateral: Secondary | ICD-10-CM | POA: Diagnosis not present

## 2015-07-23 DIAGNOSIS — H52203 Unspecified astigmatism, bilateral: Secondary | ICD-10-CM | POA: Diagnosis not present

## 2015-08-10 DIAGNOSIS — N281 Cyst of kidney, acquired: Secondary | ICD-10-CM | POA: Diagnosis not present

## 2015-08-10 DIAGNOSIS — N200 Calculus of kidney: Secondary | ICD-10-CM | POA: Diagnosis not present

## 2015-08-15 DIAGNOSIS — Z0000 Encounter for general adult medical examination without abnormal findings: Secondary | ICD-10-CM | POA: Diagnosis not present

## 2015-08-15 DIAGNOSIS — N1330 Unspecified hydronephrosis: Secondary | ICD-10-CM | POA: Diagnosis not present

## 2015-08-15 DIAGNOSIS — N281 Cyst of kidney, acquired: Secondary | ICD-10-CM | POA: Diagnosis not present

## 2015-09-20 DIAGNOSIS — K5730 Diverticulosis of large intestine without perforation or abscess without bleeding: Secondary | ICD-10-CM | POA: Diagnosis not present

## 2015-09-20 DIAGNOSIS — Z1211 Encounter for screening for malignant neoplasm of colon: Secondary | ICD-10-CM | POA: Diagnosis not present

## 2015-10-17 DIAGNOSIS — H9041 Sensorineural hearing loss, unilateral, right ear, with unrestricted hearing on the contralateral side: Secondary | ICD-10-CM | POA: Diagnosis not present

## 2015-12-09 IMAGING — CT CT GUIDANCE NEEDLE PLACEMENT
1 of 3 series · 22 of 30 positions shown, 29 images · non-contrast
Comparison: CT abdomen pelvis - 11/11/2014;

INDICATION: Patient with history of enlarging hemorrhagic right-sided parapelvic
cyst, now resulting in mild obstructive right-sided uropathy. Please
perform CT-guided renal cyst aspiration for therapeutic and
diagnostic purposes.

EXAM:
CT-GUIDED ASPIRATION OF DOMINANT RIGHT-SIDED PARAPELVIC CYST

[Series 6: (hospital) 6.0 b30f · axial · 1.14mm/px · z∈[-201,-191]mm · 22 of 24 slices shown, 29 images]
[im 2/24  soft-tissue]
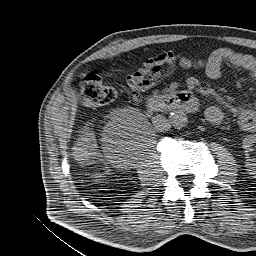
[im 2/24  bone]
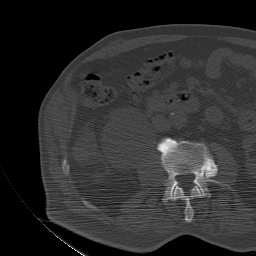
[im 3/24  soft-tissue]
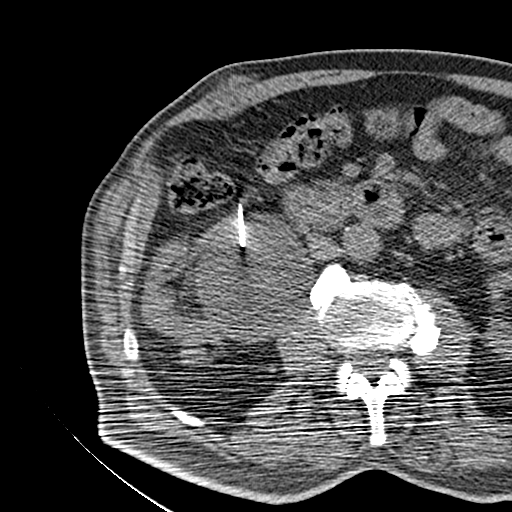
[im 4/24  soft-tissue]
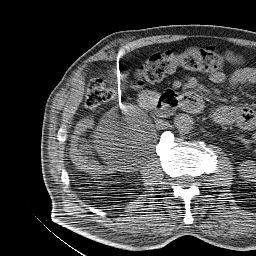
[im 5/24  soft-tissue]
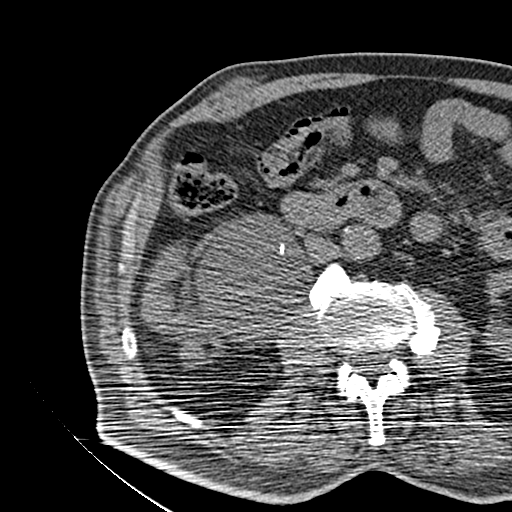
[im 6/24  soft-tissue]
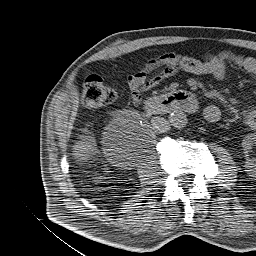
[im 7/24  soft-tissue]
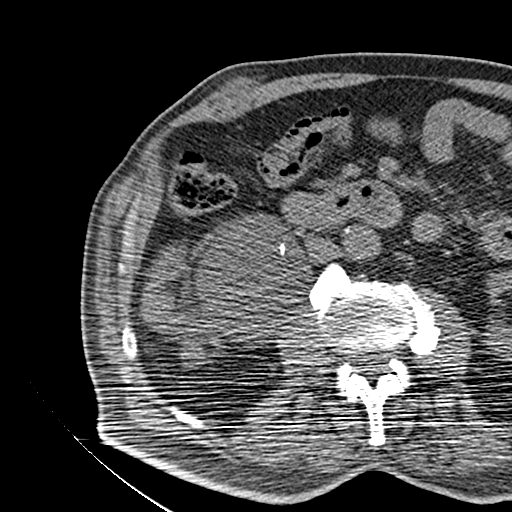
[im 8/24  soft-tissue]
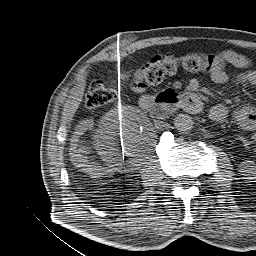
[im 9/24  soft-tissue]
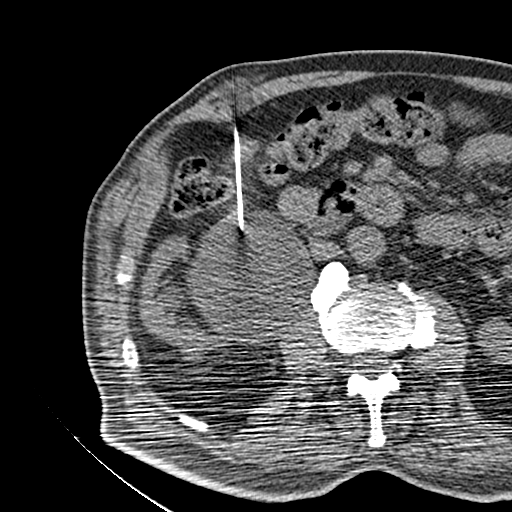
[im 10/24  soft-tissue]
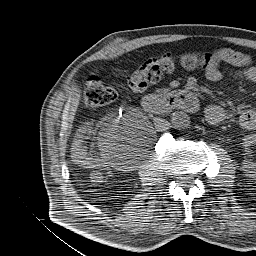
[im 10/24  bone]
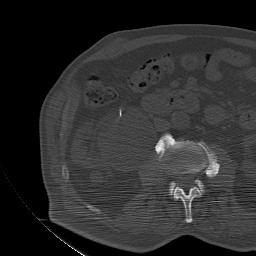
[im 11/24  soft-tissue]
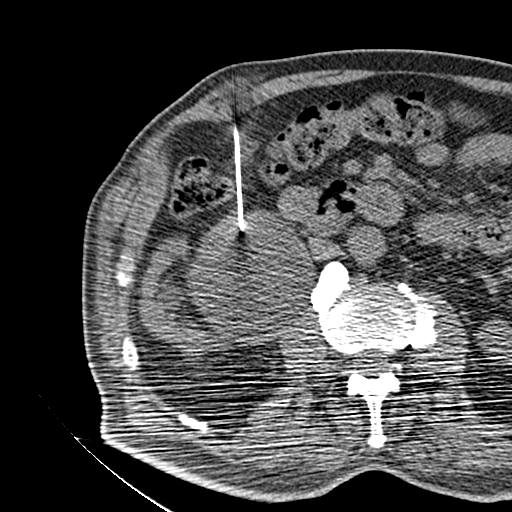
[im 12/24  soft-tissue]
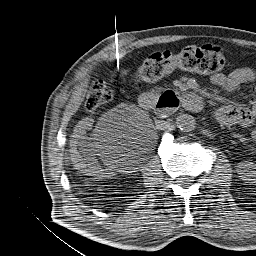
[im 13/24  soft-tissue]
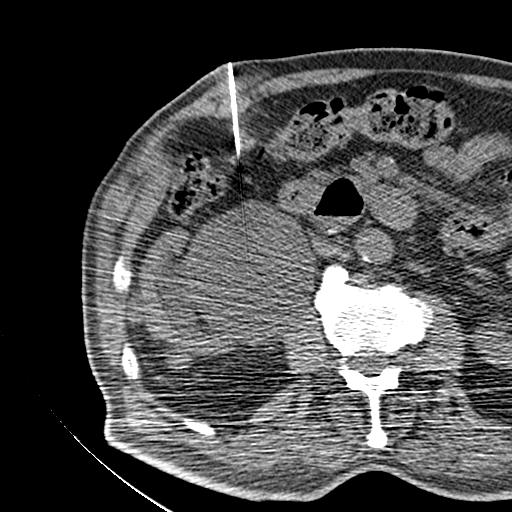
[im 13/24  lung]
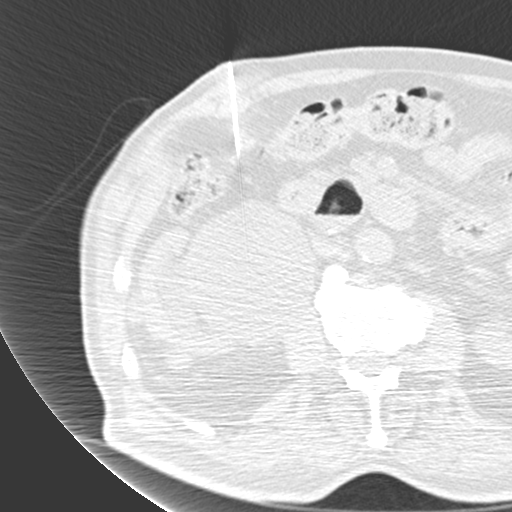
[im 14/24  soft-tissue]
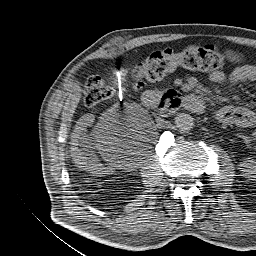
[im 14/24  lung]
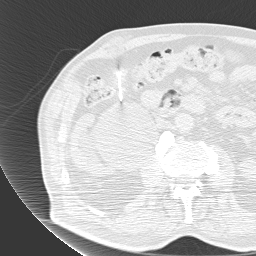
[im 15/24  soft-tissue]
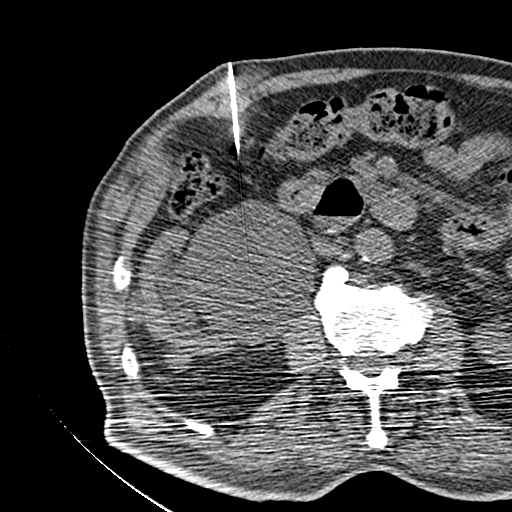
[im 15/24  lung]
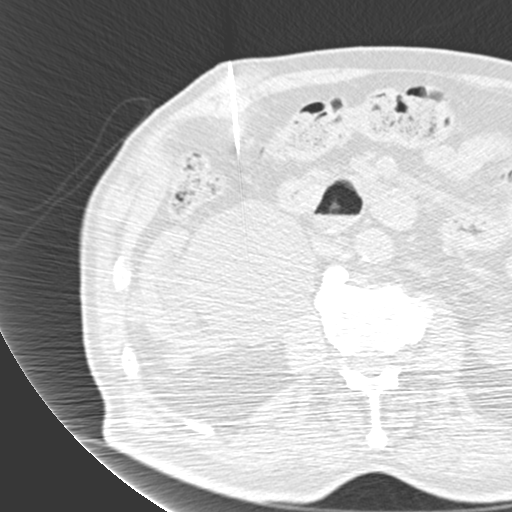
[im 16/24  soft-tissue]
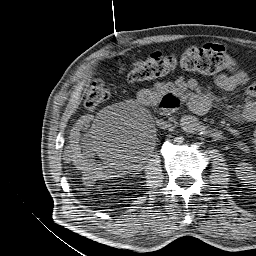
[im 16/24  lung]
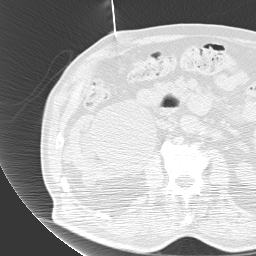
[im 17/24  soft-tissue]
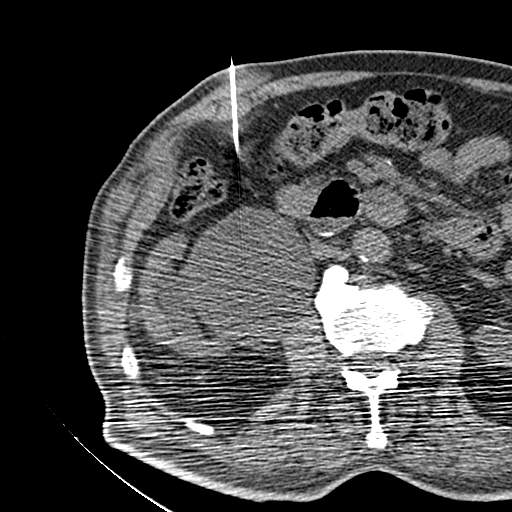
[im 18/24  soft-tissue]
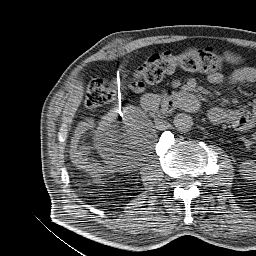
[im 18/24  bone]
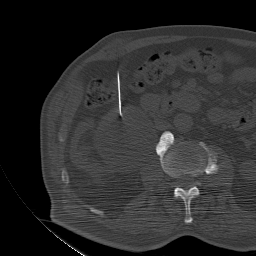
[im 19/24  soft-tissue]
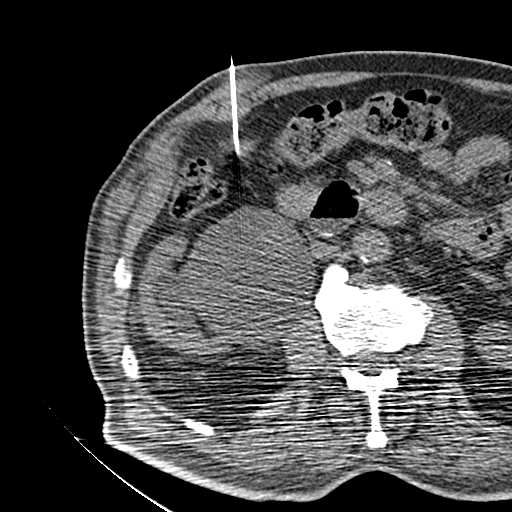
[im 20/24  soft-tissue]
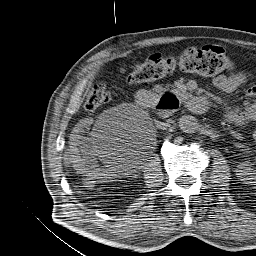
[im 21/24  soft-tissue]
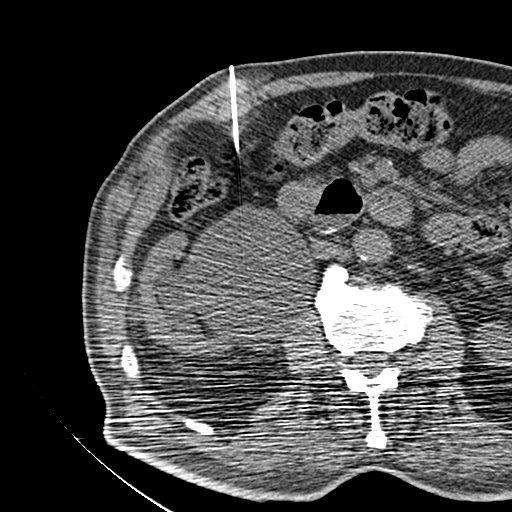
[im 22/24  soft-tissue]
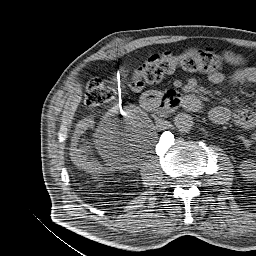
[im 23/24  soft-tissue]
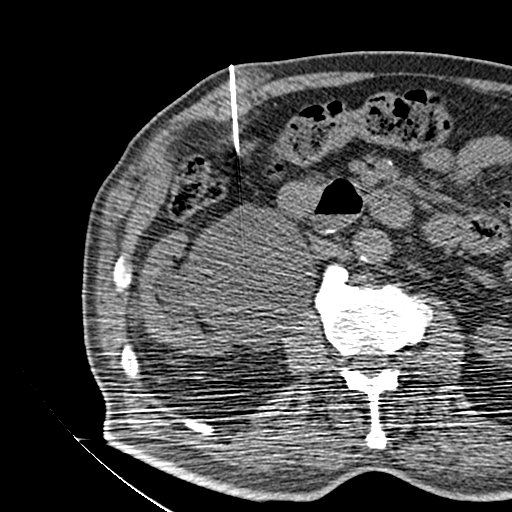

[22 of 30 positions shown; findings below may reference images not displayed]

abdominal MRI -
11/12/2014; renal ultrasound - 02/14/2015

MEDICATIONS:
None

ANESTHESIA/SEDATION:
Fentanyl 50 mcg IV; Versed 1.5 mg IV

Sedation time

12 minutes

CONTRAST:  None

COMPLICATIONS:
None immediate

PROCEDURE:
Informed consent was obtained from the patient following an
explanation of the procedure, risks, benefits and alternatives. A
time out was performed prior to the initiation of the procedure.

The patient was positioned supine on the CT table and a limited CT
was performed for procedural planning demonstrating unchanged size
and appearance of the dominant approximately 8.1 x 7.7 cm
right-sided parapelvic cyst (image 21, series 3 close. The procedure
was planned. The operative site was prepped and draped in the usual
sterile fashion. Appropriate trajectory was confirmed with a 22
gauge spinal needle after the adjacent tissues were anesthetized
with 1% Lidocaine with epinephrine.

Under intermittent CT guidance, an 18 gauge trocar needle was
advanced into the central aspect of the cyst. An Amplatz wire was
coiled within the renal cyst and appropriate positioning was
confirmed with intermittent CT fluoroscopy.

The 18 gauge trocar needle was then exchanged for a 15 cm Sample
Ceejay catheter which was utilized to aspirate approximately 200 cc
of dark brown fluid from the renal cyst. All aspirated fluid was
capped and sent to the laboratory for cytologic analysis.

Limited postprocedural scanning demonstrated significant reduction
in size of the renal cyst with residual caudal component measuring
approximately 4.6 x 1.9 cm. No evidence of complication. A dressing
was placed. The patient tolerated the procedure well without
immediate postprocedural complication.
IMPRESSION: Technically successful CT guided right-sided renal cyst aspiration
yielding 200 cc of dark brown fluid. All aspirated fluid was capped
and sent to the laboratory for cytologic analysis.

## 2015-12-13 DIAGNOSIS — M545 Low back pain: Secondary | ICD-10-CM | POA: Diagnosis not present

## 2015-12-13 DIAGNOSIS — Z1389 Encounter for screening for other disorder: Secondary | ICD-10-CM | POA: Diagnosis not present

## 2015-12-13 DIAGNOSIS — E784 Other hyperlipidemia: Secondary | ICD-10-CM | POA: Diagnosis not present

## 2015-12-13 DIAGNOSIS — Z6826 Body mass index (BMI) 26.0-26.9, adult: Secondary | ICD-10-CM | POA: Diagnosis not present

## 2015-12-13 DIAGNOSIS — E876 Hypokalemia: Secondary | ICD-10-CM | POA: Diagnosis not present

## 2015-12-13 DIAGNOSIS — I10 Essential (primary) hypertension: Secondary | ICD-10-CM | POA: Diagnosis not present

## 2015-12-13 DIAGNOSIS — E119 Type 2 diabetes mellitus without complications: Secondary | ICD-10-CM | POA: Diagnosis not present

## 2015-12-24 IMAGING — CR DG CHEST 2V
2 series · 2 of 2 positions shown · non-contrast
Comparison: None.

CLINICAL DATA: Pre-admission. Surgery scheduled for 03/22/2015.
L3-4 microdiscectomy. History of hypertension, pre diabetes.

EXAM:
CHEST  2 VIEW

[w chest pa]
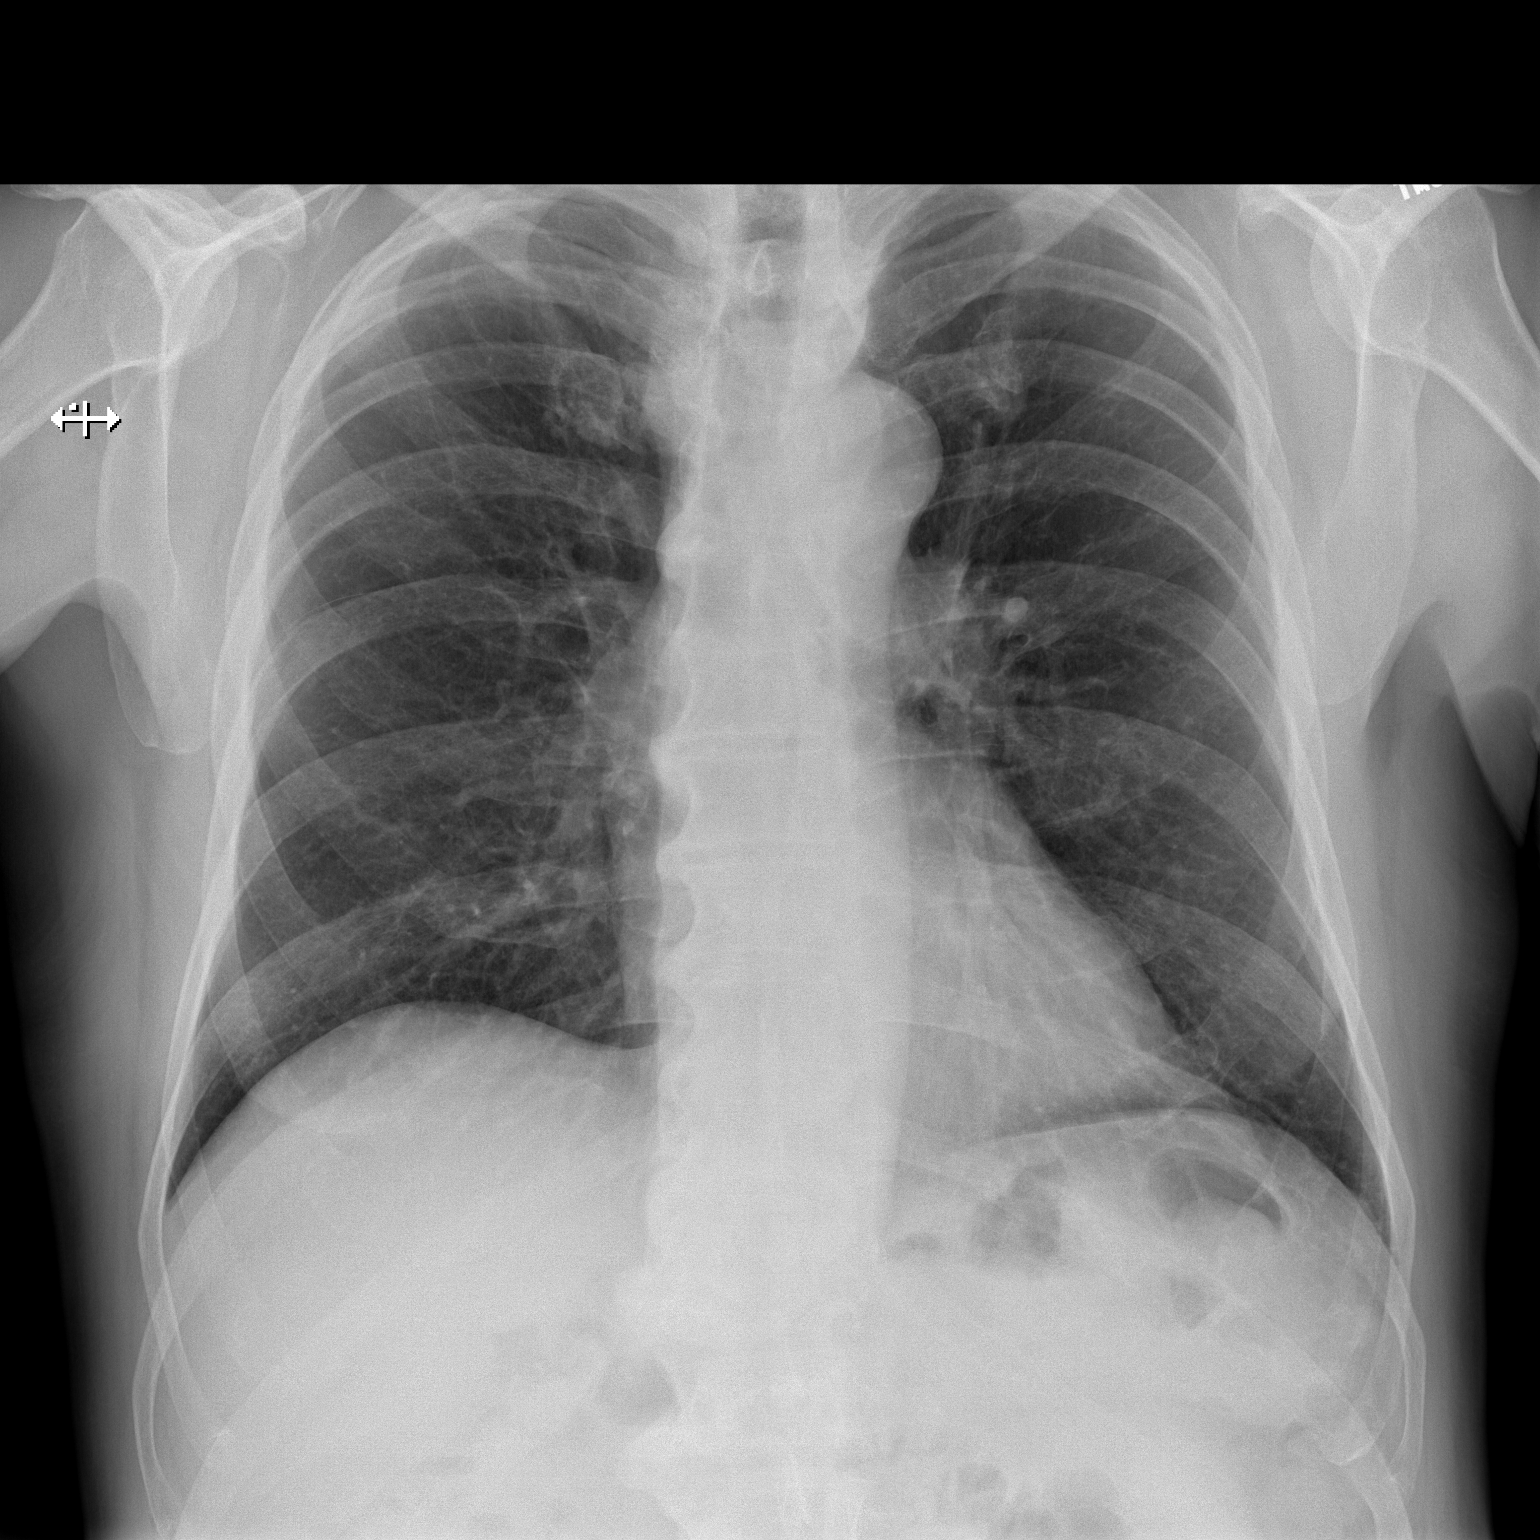

[w chest lat]
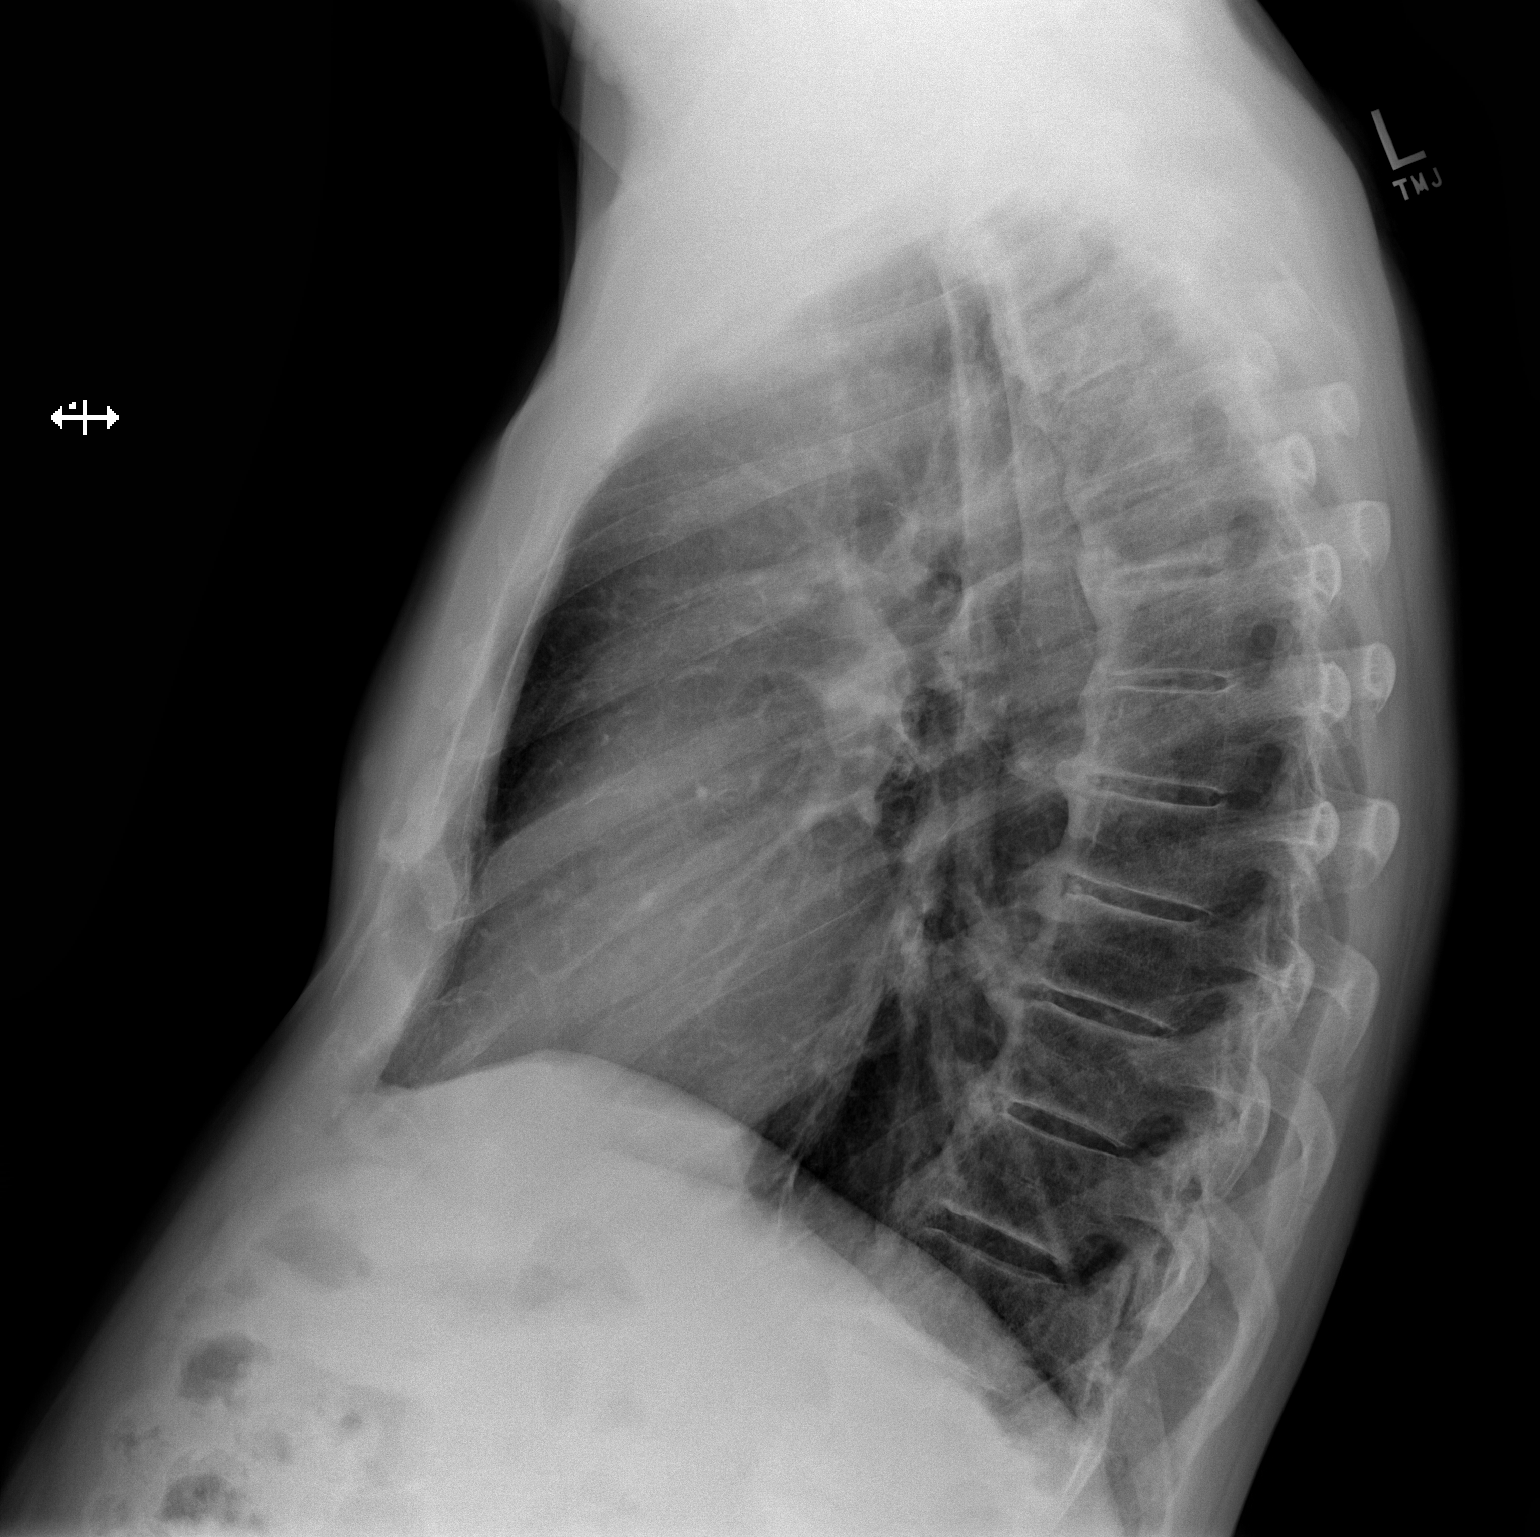

[2 of 2 positions shown; findings below may reference images not displayed]

FINDINGS: The heart size and mediastinal contours are within normal limits.
Both lungs are clear. Moderate thoracic spondylosis present.
IMPRESSION: No evidence for acute  abnormality.

## 2016-02-20 DIAGNOSIS — N281 Cyst of kidney, acquired: Secondary | ICD-10-CM | POA: Diagnosis not present

## 2016-02-27 DIAGNOSIS — N281 Cyst of kidney, acquired: Secondary | ICD-10-CM | POA: Diagnosis not present

## 2016-04-26 DIAGNOSIS — Z23 Encounter for immunization: Secondary | ICD-10-CM | POA: Diagnosis not present

## 2016-06-25 DIAGNOSIS — E784 Other hyperlipidemia: Secondary | ICD-10-CM | POA: Diagnosis not present

## 2016-06-25 DIAGNOSIS — D2271 Melanocytic nevi of right lower limb, including hip: Secondary | ICD-10-CM | POA: Diagnosis not present

## 2016-06-25 DIAGNOSIS — Z125 Encounter for screening for malignant neoplasm of prostate: Secondary | ICD-10-CM | POA: Diagnosis not present

## 2016-06-25 DIAGNOSIS — D1801 Hemangioma of skin and subcutaneous tissue: Secondary | ICD-10-CM | POA: Diagnosis not present

## 2016-06-25 DIAGNOSIS — L821 Other seborrheic keratosis: Secondary | ICD-10-CM | POA: Diagnosis not present

## 2016-06-25 DIAGNOSIS — M109 Gout, unspecified: Secondary | ICD-10-CM | POA: Diagnosis not present

## 2016-06-25 DIAGNOSIS — Z85828 Personal history of other malignant neoplasm of skin: Secondary | ICD-10-CM | POA: Diagnosis not present

## 2016-06-25 DIAGNOSIS — L570 Actinic keratosis: Secondary | ICD-10-CM | POA: Diagnosis not present

## 2016-06-25 DIAGNOSIS — I10 Essential (primary) hypertension: Secondary | ICD-10-CM | POA: Diagnosis not present

## 2016-06-25 DIAGNOSIS — R8299 Other abnormal findings in urine: Secondary | ICD-10-CM | POA: Diagnosis not present

## 2016-06-25 DIAGNOSIS — E119 Type 2 diabetes mellitus without complications: Secondary | ICD-10-CM | POA: Diagnosis not present

## 2016-06-25 DIAGNOSIS — D225 Melanocytic nevi of trunk: Secondary | ICD-10-CM | POA: Diagnosis not present

## 2016-06-25 DIAGNOSIS — D2371 Other benign neoplasm of skin of right lower limb, including hip: Secondary | ICD-10-CM | POA: Diagnosis not present

## 2016-07-02 DIAGNOSIS — N1330 Unspecified hydronephrosis: Secondary | ICD-10-CM | POA: Diagnosis not present

## 2016-07-02 DIAGNOSIS — E119 Type 2 diabetes mellitus without complications: Secondary | ICD-10-CM | POA: Diagnosis not present

## 2016-07-02 DIAGNOSIS — E784 Other hyperlipidemia: Secondary | ICD-10-CM | POA: Diagnosis not present

## 2016-07-02 DIAGNOSIS — M4810 Ankylosing hyperostosis [Forestier], site unspecified: Secondary | ICD-10-CM | POA: Diagnosis not present

## 2016-07-02 DIAGNOSIS — E876 Hypokalemia: Secondary | ICD-10-CM | POA: Diagnosis not present

## 2016-07-02 DIAGNOSIS — Z1389 Encounter for screening for other disorder: Secondary | ICD-10-CM | POA: Diagnosis not present

## 2016-07-02 DIAGNOSIS — M479 Spondylosis, unspecified: Secondary | ICD-10-CM | POA: Diagnosis not present

## 2016-07-02 DIAGNOSIS — Z6827 Body mass index (BMI) 27.0-27.9, adult: Secondary | ICD-10-CM | POA: Diagnosis not present

## 2016-07-02 DIAGNOSIS — Z0000 Encounter for general adult medical examination without abnormal findings: Secondary | ICD-10-CM | POA: Diagnosis not present

## 2016-07-02 DIAGNOSIS — H9193 Unspecified hearing loss, bilateral: Secondary | ICD-10-CM | POA: Diagnosis not present

## 2016-07-02 DIAGNOSIS — N281 Cyst of kidney, acquired: Secondary | ICD-10-CM | POA: Diagnosis not present

## 2016-07-02 DIAGNOSIS — K5909 Other constipation: Secondary | ICD-10-CM | POA: Diagnosis not present

## 2016-07-28 DIAGNOSIS — H52203 Unspecified astigmatism, bilateral: Secondary | ICD-10-CM | POA: Diagnosis not present

## 2016-07-28 DIAGNOSIS — H26493 Other secondary cataract, bilateral: Secondary | ICD-10-CM | POA: Diagnosis not present

## 2017-01-12 DIAGNOSIS — E119 Type 2 diabetes mellitus without complications: Secondary | ICD-10-CM | POA: Diagnosis not present

## 2017-02-25 DIAGNOSIS — N281 Cyst of kidney, acquired: Secondary | ICD-10-CM | POA: Diagnosis not present

## 2017-04-25 DIAGNOSIS — Z23 Encounter for immunization: Secondary | ICD-10-CM | POA: Diagnosis not present

## 2017-06-29 DIAGNOSIS — L570 Actinic keratosis: Secondary | ICD-10-CM | POA: Diagnosis not present

## 2017-06-29 DIAGNOSIS — D2271 Melanocytic nevi of right lower limb, including hip: Secondary | ICD-10-CM | POA: Diagnosis not present

## 2017-06-29 DIAGNOSIS — Z85828 Personal history of other malignant neoplasm of skin: Secondary | ICD-10-CM | POA: Diagnosis not present

## 2017-06-29 DIAGNOSIS — L821 Other seborrheic keratosis: Secondary | ICD-10-CM | POA: Diagnosis not present

## 2017-06-29 DIAGNOSIS — D2371 Other benign neoplasm of skin of right lower limb, including hip: Secondary | ICD-10-CM | POA: Diagnosis not present

## 2017-08-17 DIAGNOSIS — Z961 Presence of intraocular lens: Secondary | ICD-10-CM | POA: Diagnosis not present

## 2017-08-17 DIAGNOSIS — H52203 Unspecified astigmatism, bilateral: Secondary | ICD-10-CM | POA: Diagnosis not present

## 2017-08-17 DIAGNOSIS — H26493 Other secondary cataract, bilateral: Secondary | ICD-10-CM | POA: Diagnosis not present

## 2017-09-30 DIAGNOSIS — M109 Gout, unspecified: Secondary | ICD-10-CM | POA: Diagnosis not present

## 2017-09-30 DIAGNOSIS — E119 Type 2 diabetes mellitus without complications: Secondary | ICD-10-CM | POA: Diagnosis not present

## 2017-09-30 DIAGNOSIS — Z125 Encounter for screening for malignant neoplasm of prostate: Secondary | ICD-10-CM | POA: Diagnosis not present

## 2017-09-30 DIAGNOSIS — R82998 Other abnormal findings in urine: Secondary | ICD-10-CM | POA: Diagnosis not present

## 2017-09-30 DIAGNOSIS — I10 Essential (primary) hypertension: Secondary | ICD-10-CM | POA: Diagnosis not present

## 2017-09-30 DIAGNOSIS — E7849 Other hyperlipidemia: Secondary | ICD-10-CM | POA: Diagnosis not present

## 2017-10-07 DIAGNOSIS — Z1389 Encounter for screening for other disorder: Secondary | ICD-10-CM | POA: Diagnosis not present

## 2017-10-07 DIAGNOSIS — N281 Cyst of kidney, acquired: Secondary | ICD-10-CM | POA: Diagnosis not present

## 2017-10-07 DIAGNOSIS — M545 Low back pain: Secondary | ICD-10-CM | POA: Diagnosis not present

## 2017-10-07 DIAGNOSIS — Z0000 Encounter for general adult medical examination without abnormal findings: Secondary | ICD-10-CM | POA: Diagnosis not present

## 2017-10-07 DIAGNOSIS — M479 Spondylosis, unspecified: Secondary | ICD-10-CM | POA: Diagnosis not present

## 2017-10-07 DIAGNOSIS — E119 Type 2 diabetes mellitus without complications: Secondary | ICD-10-CM | POA: Diagnosis not present

## 2017-10-07 DIAGNOSIS — N1330 Unspecified hydronephrosis: Secondary | ICD-10-CM | POA: Diagnosis not present

## 2017-10-07 DIAGNOSIS — N401 Enlarged prostate with lower urinary tract symptoms: Secondary | ICD-10-CM | POA: Diagnosis not present

## 2017-10-07 DIAGNOSIS — Z6827 Body mass index (BMI) 27.0-27.9, adult: Secondary | ICD-10-CM | POA: Diagnosis not present

## 2017-10-07 DIAGNOSIS — M4810 Ankylosing hyperostosis [Forestier], site unspecified: Secondary | ICD-10-CM | POA: Diagnosis not present

## 2017-10-07 DIAGNOSIS — I10 Essential (primary) hypertension: Secondary | ICD-10-CM | POA: Diagnosis not present

## 2017-10-07 DIAGNOSIS — E7849 Other hyperlipidemia: Secondary | ICD-10-CM | POA: Diagnosis not present

## 2017-10-08 DIAGNOSIS — Z1212 Encounter for screening for malignant neoplasm of rectum: Secondary | ICD-10-CM | POA: Diagnosis not present

## 2017-10-19 DIAGNOSIS — Z13820 Encounter for screening for osteoporosis: Secondary | ICD-10-CM | POA: Diagnosis not present

## 2017-10-20 ENCOUNTER — Other Ambulatory Visit: Payer: Self-pay | Admitting: General Surgery

## 2017-10-20 DIAGNOSIS — Z8719 Personal history of other diseases of the digestive system: Secondary | ICD-10-CM | POA: Diagnosis not present

## 2017-10-20 DIAGNOSIS — Z9079 Acquired absence of other genital organ(s): Secondary | ICD-10-CM | POA: Diagnosis not present

## 2017-10-20 DIAGNOSIS — K4090 Unilateral inguinal hernia, without obstruction or gangrene, not specified as recurrent: Secondary | ICD-10-CM | POA: Diagnosis not present

## 2017-10-20 DIAGNOSIS — N400 Enlarged prostate without lower urinary tract symptoms: Secondary | ICD-10-CM | POA: Diagnosis not present

## 2017-10-20 DIAGNOSIS — R7303 Prediabetes: Secondary | ICD-10-CM | POA: Diagnosis not present

## 2017-10-20 DIAGNOSIS — I10 Essential (primary) hypertension: Secondary | ICD-10-CM | POA: Diagnosis not present

## 2017-10-20 DIAGNOSIS — Z9889 Other specified postprocedural states: Secondary | ICD-10-CM | POA: Diagnosis not present

## 2017-12-01 DIAGNOSIS — K4090 Unilateral inguinal hernia, without obstruction or gangrene, not specified as recurrent: Secondary | ICD-10-CM | POA: Diagnosis not present

## 2017-12-01 DIAGNOSIS — G8918 Other acute postprocedural pain: Secondary | ICD-10-CM | POA: Diagnosis not present

## 2018-03-16 DIAGNOSIS — N281 Cyst of kidney, acquired: Secondary | ICD-10-CM | POA: Diagnosis not present

## 2018-03-24 DIAGNOSIS — E119 Type 2 diabetes mellitus without complications: Secondary | ICD-10-CM | POA: Diagnosis not present

## 2018-05-08 DIAGNOSIS — Z23 Encounter for immunization: Secondary | ICD-10-CM | POA: Diagnosis not present

## 2018-06-29 DIAGNOSIS — D220 Melanocytic nevi of lip: Secondary | ICD-10-CM | POA: Diagnosis not present

## 2018-06-29 DIAGNOSIS — L821 Other seborrheic keratosis: Secondary | ICD-10-CM | POA: Diagnosis not present

## 2018-06-29 DIAGNOSIS — Z85828 Personal history of other malignant neoplasm of skin: Secondary | ICD-10-CM | POA: Diagnosis not present

## 2018-06-29 DIAGNOSIS — L309 Dermatitis, unspecified: Secondary | ICD-10-CM | POA: Diagnosis not present

## 2018-06-29 DIAGNOSIS — L570 Actinic keratosis: Secondary | ICD-10-CM | POA: Diagnosis not present

## 2018-06-29 DIAGNOSIS — D225 Melanocytic nevi of trunk: Secondary | ICD-10-CM | POA: Diagnosis not present

## 2018-06-29 DIAGNOSIS — D2362 Other benign neoplasm of skin of left upper limb, including shoulder: Secondary | ICD-10-CM | POA: Diagnosis not present

## 2018-08-30 DIAGNOSIS — H26493 Other secondary cataract, bilateral: Secondary | ICD-10-CM | POA: Diagnosis not present

## 2018-08-30 DIAGNOSIS — H52203 Unspecified astigmatism, bilateral: Secondary | ICD-10-CM | POA: Diagnosis not present

## 2018-11-22 DIAGNOSIS — I10 Essential (primary) hypertension: Secondary | ICD-10-CM | POA: Diagnosis not present

## 2018-11-22 DIAGNOSIS — E119 Type 2 diabetes mellitus without complications: Secondary | ICD-10-CM | POA: Diagnosis not present

## 2018-11-22 DIAGNOSIS — R82998 Other abnormal findings in urine: Secondary | ICD-10-CM | POA: Diagnosis not present

## 2018-11-22 DIAGNOSIS — M109 Gout, unspecified: Secondary | ICD-10-CM | POA: Diagnosis not present

## 2018-11-22 DIAGNOSIS — Z125 Encounter for screening for malignant neoplasm of prostate: Secondary | ICD-10-CM | POA: Diagnosis not present

## 2018-11-22 DIAGNOSIS — E7849 Other hyperlipidemia: Secondary | ICD-10-CM | POA: Diagnosis not present

## 2018-11-29 DIAGNOSIS — M4810 Ankylosing hyperostosis [Forestier], site unspecified: Secondary | ICD-10-CM | POA: Diagnosis not present

## 2018-11-29 DIAGNOSIS — M545 Low back pain: Secondary | ICD-10-CM | POA: Diagnosis not present

## 2018-11-29 DIAGNOSIS — Z0000 Encounter for general adult medical examination without abnormal findings: Secondary | ICD-10-CM | POA: Diagnosis not present

## 2018-11-29 DIAGNOSIS — Z1331 Encounter for screening for depression: Secondary | ICD-10-CM | POA: Diagnosis not present

## 2018-11-29 DIAGNOSIS — J309 Allergic rhinitis, unspecified: Secondary | ICD-10-CM | POA: Diagnosis not present

## 2018-11-29 DIAGNOSIS — N1330 Unspecified hydronephrosis: Secondary | ICD-10-CM | POA: Diagnosis not present

## 2018-11-29 DIAGNOSIS — K4090 Unilateral inguinal hernia, without obstruction or gangrene, not specified as recurrent: Secondary | ICD-10-CM | POA: Diagnosis not present

## 2018-11-29 DIAGNOSIS — N401 Enlarged prostate with lower urinary tract symptoms: Secondary | ICD-10-CM | POA: Diagnosis not present

## 2018-11-29 DIAGNOSIS — M479 Spondylosis, unspecified: Secondary | ICD-10-CM | POA: Diagnosis not present

## 2018-11-29 DIAGNOSIS — E1169 Type 2 diabetes mellitus with other specified complication: Secondary | ICD-10-CM | POA: Diagnosis not present

## 2018-11-29 DIAGNOSIS — N281 Cyst of kidney, acquired: Secondary | ICD-10-CM | POA: Diagnosis not present

## 2019-05-12 DIAGNOSIS — Z23 Encounter for immunization: Secondary | ICD-10-CM | POA: Diagnosis not present

## 2019-05-25 DIAGNOSIS — H26492 Other secondary cataract, left eye: Secondary | ICD-10-CM | POA: Diagnosis not present

## 2019-06-07 DIAGNOSIS — D696 Thrombocytopenia, unspecified: Secondary | ICD-10-CM | POA: Diagnosis not present

## 2019-06-07 DIAGNOSIS — I10 Essential (primary) hypertension: Secondary | ICD-10-CM | POA: Diagnosis not present

## 2019-06-07 DIAGNOSIS — E876 Hypokalemia: Secondary | ICD-10-CM | POA: Diagnosis not present

## 2019-06-07 DIAGNOSIS — R946 Abnormal results of thyroid function studies: Secondary | ICD-10-CM | POA: Diagnosis not present

## 2019-06-07 DIAGNOSIS — N401 Enlarged prostate with lower urinary tract symptoms: Secondary | ICD-10-CM | POA: Diagnosis not present

## 2019-06-07 DIAGNOSIS — E7849 Other hyperlipidemia: Secondary | ICD-10-CM | POA: Diagnosis not present

## 2019-06-07 DIAGNOSIS — M4810 Ankylosing hyperostosis [Forestier], site unspecified: Secondary | ICD-10-CM | POA: Diagnosis not present

## 2019-06-07 DIAGNOSIS — M479 Spondylosis, unspecified: Secondary | ICD-10-CM | POA: Diagnosis not present

## 2019-06-07 DIAGNOSIS — E785 Hyperlipidemia, unspecified: Secondary | ICD-10-CM | POA: Diagnosis not present

## 2019-06-07 DIAGNOSIS — E1169 Type 2 diabetes mellitus with other specified complication: Secondary | ICD-10-CM | POA: Diagnosis not present

## 2019-06-07 DIAGNOSIS — M109 Gout, unspecified: Secondary | ICD-10-CM | POA: Diagnosis not present

## 2019-06-30 DIAGNOSIS — H26492 Other secondary cataract, left eye: Secondary | ICD-10-CM | POA: Diagnosis not present

## 2019-07-04 DIAGNOSIS — L821 Other seborrheic keratosis: Secondary | ICD-10-CM | POA: Diagnosis not present

## 2019-07-04 DIAGNOSIS — Z85828 Personal history of other malignant neoplasm of skin: Secondary | ICD-10-CM | POA: Diagnosis not present

## 2019-07-04 DIAGNOSIS — D225 Melanocytic nevi of trunk: Secondary | ICD-10-CM | POA: Diagnosis not present

## 2019-07-04 DIAGNOSIS — D1801 Hemangioma of skin and subcutaneous tissue: Secondary | ICD-10-CM | POA: Diagnosis not present

## 2019-07-04 DIAGNOSIS — L570 Actinic keratosis: Secondary | ICD-10-CM | POA: Diagnosis not present

## 2019-07-19 DIAGNOSIS — E7849 Other hyperlipidemia: Secondary | ICD-10-CM | POA: Diagnosis not present

## 2019-08-15 DIAGNOSIS — Z23 Encounter for immunization: Secondary | ICD-10-CM | POA: Diagnosis not present

## 2019-09-12 DIAGNOSIS — Z23 Encounter for immunization: Secondary | ICD-10-CM | POA: Diagnosis not present

## 2019-12-26 DIAGNOSIS — E1169 Type 2 diabetes mellitus with other specified complication: Secondary | ICD-10-CM | POA: Diagnosis not present

## 2019-12-26 DIAGNOSIS — R946 Abnormal results of thyroid function studies: Secondary | ICD-10-CM | POA: Diagnosis not present

## 2019-12-26 DIAGNOSIS — E7849 Other hyperlipidemia: Secondary | ICD-10-CM | POA: Diagnosis not present

## 2019-12-26 DIAGNOSIS — M109 Gout, unspecified: Secondary | ICD-10-CM | POA: Diagnosis not present

## 2019-12-26 DIAGNOSIS — Z125 Encounter for screening for malignant neoplasm of prostate: Secondary | ICD-10-CM | POA: Diagnosis not present

## 2020-01-05 DIAGNOSIS — M4810 Ankylosing hyperostosis [Forestier], site unspecified: Secondary | ICD-10-CM | POA: Diagnosis not present

## 2020-01-05 DIAGNOSIS — D696 Thrombocytopenia, unspecified: Secondary | ICD-10-CM | POA: Diagnosis not present

## 2020-01-05 DIAGNOSIS — M479 Spondylosis, unspecified: Secondary | ICD-10-CM | POA: Diagnosis not present

## 2020-01-05 DIAGNOSIS — K5900 Constipation, unspecified: Secondary | ICD-10-CM | POA: Diagnosis not present

## 2020-01-05 DIAGNOSIS — Z1331 Encounter for screening for depression: Secondary | ICD-10-CM | POA: Diagnosis not present

## 2020-01-05 DIAGNOSIS — M545 Low back pain: Secondary | ICD-10-CM | POA: Diagnosis not present

## 2020-01-05 DIAGNOSIS — N281 Cyst of kidney, acquired: Secondary | ICD-10-CM | POA: Diagnosis not present

## 2020-01-05 DIAGNOSIS — R82998 Other abnormal findings in urine: Secondary | ICD-10-CM | POA: Diagnosis not present

## 2020-01-05 DIAGNOSIS — N401 Enlarged prostate with lower urinary tract symptoms: Secondary | ICD-10-CM | POA: Diagnosis not present

## 2020-01-05 DIAGNOSIS — Z0000 Encounter for general adult medical examination without abnormal findings: Secondary | ICD-10-CM | POA: Diagnosis not present

## 2020-01-05 DIAGNOSIS — E876 Hypokalemia: Secondary | ICD-10-CM | POA: Diagnosis not present

## 2020-01-05 DIAGNOSIS — E1169 Type 2 diabetes mellitus with other specified complication: Secondary | ICD-10-CM | POA: Diagnosis not present

## 2020-01-05 DIAGNOSIS — R300 Dysuria: Secondary | ICD-10-CM | POA: Diagnosis not present

## 2020-01-12 DIAGNOSIS — Z1212 Encounter for screening for malignant neoplasm of rectum: Secondary | ICD-10-CM | POA: Diagnosis not present

## 2020-01-27 DIAGNOSIS — E1169 Type 2 diabetes mellitus with other specified complication: Secondary | ICD-10-CM | POA: Diagnosis not present

## 2020-01-27 DIAGNOSIS — R972 Elevated prostate specific antigen [PSA]: Secondary | ICD-10-CM | POA: Diagnosis not present

## 2020-02-15 DIAGNOSIS — H6121 Impacted cerumen, right ear: Secondary | ICD-10-CM | POA: Diagnosis not present

## 2020-02-15 DIAGNOSIS — Z974 Presence of external hearing-aid: Secondary | ICD-10-CM | POA: Diagnosis not present

## 2020-02-15 DIAGNOSIS — H9041 Sensorineural hearing loss, unilateral, right ear, with unrestricted hearing on the contralateral side: Secondary | ICD-10-CM | POA: Diagnosis not present

## 2020-03-27 DIAGNOSIS — H903 Sensorineural hearing loss, bilateral: Secondary | ICD-10-CM | POA: Diagnosis not present

## 2020-06-16 DIAGNOSIS — Z23 Encounter for immunization: Secondary | ICD-10-CM | POA: Diagnosis not present

## 2020-07-02 DIAGNOSIS — Z125 Encounter for screening for malignant neoplasm of prostate: Secondary | ICD-10-CM | POA: Diagnosis not present

## 2020-07-02 DIAGNOSIS — E1169 Type 2 diabetes mellitus with other specified complication: Secondary | ICD-10-CM | POA: Diagnosis not present

## 2020-07-02 DIAGNOSIS — E785 Hyperlipidemia, unspecified: Secondary | ICD-10-CM | POA: Diagnosis not present

## 2020-07-09 DIAGNOSIS — L570 Actinic keratosis: Secondary | ICD-10-CM | POA: Diagnosis not present

## 2020-07-09 DIAGNOSIS — E1169 Type 2 diabetes mellitus with other specified complication: Secondary | ICD-10-CM | POA: Diagnosis not present

## 2020-07-09 DIAGNOSIS — D2362 Other benign neoplasm of skin of left upper limb, including shoulder: Secondary | ICD-10-CM | POA: Diagnosis not present

## 2020-07-09 DIAGNOSIS — R946 Abnormal results of thyroid function studies: Secondary | ICD-10-CM | POA: Diagnosis not present

## 2020-07-09 DIAGNOSIS — D696 Thrombocytopenia, unspecified: Secondary | ICD-10-CM | POA: Diagnosis not present

## 2020-07-09 DIAGNOSIS — D225 Melanocytic nevi of trunk: Secondary | ICD-10-CM | POA: Diagnosis not present

## 2020-07-09 DIAGNOSIS — Z85828 Personal history of other malignant neoplasm of skin: Secondary | ICD-10-CM | POA: Diagnosis not present

## 2020-07-09 DIAGNOSIS — L821 Other seborrheic keratosis: Secondary | ICD-10-CM | POA: Diagnosis not present

## 2020-07-09 DIAGNOSIS — I10 Essential (primary) hypertension: Secondary | ICD-10-CM | POA: Diagnosis not present

## 2020-07-09 DIAGNOSIS — E785 Hyperlipidemia, unspecified: Secondary | ICD-10-CM | POA: Diagnosis not present

## 2020-07-13 DIAGNOSIS — B351 Tinea unguium: Secondary | ICD-10-CM | POA: Diagnosis not present

## 2020-07-13 DIAGNOSIS — Q6689 Other  specified congenital deformities of feet: Secondary | ICD-10-CM | POA: Diagnosis not present

## 2020-07-13 DIAGNOSIS — L84 Corns and callosities: Secondary | ICD-10-CM | POA: Diagnosis not present

## 2020-07-13 DIAGNOSIS — E1159 Type 2 diabetes mellitus with other circulatory complications: Secondary | ICD-10-CM | POA: Diagnosis not present

## 2020-09-04 DIAGNOSIS — C44629 Squamous cell carcinoma of skin of left upper limb, including shoulder: Secondary | ICD-10-CM | POA: Diagnosis not present

## 2020-09-04 DIAGNOSIS — D485 Neoplasm of uncertain behavior of skin: Secondary | ICD-10-CM | POA: Diagnosis not present

## 2020-09-04 DIAGNOSIS — Z85828 Personal history of other malignant neoplasm of skin: Secondary | ICD-10-CM | POA: Diagnosis not present

## 2020-09-07 DIAGNOSIS — Z961 Presence of intraocular lens: Secondary | ICD-10-CM | POA: Diagnosis not present

## 2020-09-07 DIAGNOSIS — H52203 Unspecified astigmatism, bilateral: Secondary | ICD-10-CM | POA: Diagnosis not present

## 2020-09-07 DIAGNOSIS — E119 Type 2 diabetes mellitus without complications: Secondary | ICD-10-CM | POA: Diagnosis not present

## 2020-10-26 DIAGNOSIS — B351 Tinea unguium: Secondary | ICD-10-CM | POA: Diagnosis not present

## 2020-10-26 DIAGNOSIS — E1159 Type 2 diabetes mellitus with other circulatory complications: Secondary | ICD-10-CM | POA: Diagnosis not present

## 2020-10-26 DIAGNOSIS — L84 Corns and callosities: Secondary | ICD-10-CM | POA: Diagnosis not present

## 2021-01-14 DIAGNOSIS — E785 Hyperlipidemia, unspecified: Secondary | ICD-10-CM | POA: Diagnosis not present

## 2021-01-14 DIAGNOSIS — Z0000 Encounter for general adult medical examination without abnormal findings: Secondary | ICD-10-CM | POA: Diagnosis not present

## 2021-01-14 DIAGNOSIS — E1169 Type 2 diabetes mellitus with other specified complication: Secondary | ICD-10-CM | POA: Diagnosis not present

## 2021-01-14 DIAGNOSIS — U071 COVID-19: Secondary | ICD-10-CM | POA: Diagnosis not present

## 2021-01-14 DIAGNOSIS — R946 Abnormal results of thyroid function studies: Secondary | ICD-10-CM | POA: Diagnosis not present

## 2021-01-14 DIAGNOSIS — Z125 Encounter for screening for malignant neoplasm of prostate: Secondary | ICD-10-CM | POA: Diagnosis not present

## 2021-01-14 DIAGNOSIS — M109 Gout, unspecified: Secondary | ICD-10-CM | POA: Diagnosis not present

## 2021-01-16 DIAGNOSIS — Z23 Encounter for immunization: Secondary | ICD-10-CM | POA: Diagnosis not present

## 2021-01-18 DIAGNOSIS — E1159 Type 2 diabetes mellitus with other circulatory complications: Secondary | ICD-10-CM | POA: Diagnosis not present

## 2021-01-18 DIAGNOSIS — B351 Tinea unguium: Secondary | ICD-10-CM | POA: Diagnosis not present

## 2021-01-18 DIAGNOSIS — L84 Corns and callosities: Secondary | ICD-10-CM | POA: Diagnosis not present

## 2021-01-21 DIAGNOSIS — M109 Gout, unspecified: Secondary | ICD-10-CM | POA: Diagnosis not present

## 2021-01-21 DIAGNOSIS — D696 Thrombocytopenia, unspecified: Secondary | ICD-10-CM | POA: Diagnosis not present

## 2021-01-21 DIAGNOSIS — I10 Essential (primary) hypertension: Secondary | ICD-10-CM | POA: Diagnosis not present

## 2021-01-21 DIAGNOSIS — N1330 Unspecified hydronephrosis: Secondary | ICD-10-CM | POA: Diagnosis not present

## 2021-01-21 DIAGNOSIS — Z1331 Encounter for screening for depression: Secondary | ICD-10-CM | POA: Diagnosis not present

## 2021-01-21 DIAGNOSIS — M4810 Ankylosing hyperostosis [Forestier], site unspecified: Secondary | ICD-10-CM | POA: Diagnosis not present

## 2021-01-21 DIAGNOSIS — E1169 Type 2 diabetes mellitus with other specified complication: Secondary | ICD-10-CM | POA: Diagnosis not present

## 2021-01-21 DIAGNOSIS — E876 Hypokalemia: Secondary | ICD-10-CM | POA: Diagnosis not present

## 2021-01-21 DIAGNOSIS — E786 Lipoprotein deficiency: Secondary | ICD-10-CM | POA: Diagnosis not present

## 2021-01-21 DIAGNOSIS — Z0000 Encounter for general adult medical examination without abnormal findings: Secondary | ICD-10-CM | POA: Diagnosis not present

## 2021-01-21 DIAGNOSIS — M479 Spondylosis, unspecified: Secondary | ICD-10-CM | POA: Diagnosis not present

## 2021-01-21 DIAGNOSIS — E785 Hyperlipidemia, unspecified: Secondary | ICD-10-CM | POA: Diagnosis not present

## 2021-01-28 DIAGNOSIS — I10 Essential (primary) hypertension: Secondary | ICD-10-CM | POA: Diagnosis not present

## 2021-01-28 DIAGNOSIS — R82998 Other abnormal findings in urine: Secondary | ICD-10-CM | POA: Diagnosis not present

## 2021-03-15 DIAGNOSIS — L84 Corns and callosities: Secondary | ICD-10-CM | POA: Diagnosis not present

## 2021-03-15 DIAGNOSIS — E1159 Type 2 diabetes mellitus with other circulatory complications: Secondary | ICD-10-CM | POA: Diagnosis not present

## 2021-03-15 DIAGNOSIS — B351 Tinea unguium: Secondary | ICD-10-CM | POA: Diagnosis not present

## 2021-03-29 DIAGNOSIS — H90A12 Conductive hearing loss, unilateral, left ear with restricted hearing on the contralateral side: Secondary | ICD-10-CM | POA: Diagnosis not present

## 2021-03-29 DIAGNOSIS — H90A31 Mixed conductive and sensorineural hearing loss, unilateral, right ear with restricted hearing on the contralateral side: Secondary | ICD-10-CM | POA: Diagnosis not present

## 2021-04-11 ENCOUNTER — Emergency Department (HOSPITAL_COMMUNITY): Payer: Medicare Other | Admitting: Certified Registered"

## 2021-04-11 ENCOUNTER — Ambulatory Visit (HOSPITAL_COMMUNITY)
Admission: EM | Admit: 2021-04-11 | Discharge: 2021-05-11 | Disposition: E | Payer: Medicare Other | Attending: Emergency Medicine | Admitting: Emergency Medicine

## 2021-04-11 ENCOUNTER — Emergency Department (HOSPITAL_COMMUNITY): Payer: Medicare Other

## 2021-04-11 ENCOUNTER — Encounter (HOSPITAL_COMMUNITY): Payer: Self-pay | Admitting: Emergency Medicine

## 2021-04-11 ENCOUNTER — Other Ambulatory Visit: Payer: Self-pay

## 2021-04-11 ENCOUNTER — Ambulatory Visit (HOSPITAL_COMMUNITY): Admission: EM | Disposition: E | Payer: Self-pay | Source: Home / Self Care | Attending: Emergency Medicine

## 2021-04-11 DIAGNOSIS — I2510 Atherosclerotic heart disease of native coronary artery without angina pectoris: Secondary | ICD-10-CM | POA: Insufficient documentation

## 2021-04-11 DIAGNOSIS — I7101 Dissection of ascending aorta: Secondary | ICD-10-CM | POA: Diagnosis present

## 2021-04-11 DIAGNOSIS — R079 Chest pain, unspecified: Secondary | ICD-10-CM | POA: Diagnosis not present

## 2021-04-11 DIAGNOSIS — Z8249 Family history of ischemic heart disease and other diseases of the circulatory system: Secondary | ICD-10-CM | POA: Diagnosis not present

## 2021-04-11 DIAGNOSIS — E876 Hypokalemia: Secondary | ICD-10-CM | POA: Diagnosis not present

## 2021-04-11 DIAGNOSIS — R570 Cardiogenic shock: Secondary | ICD-10-CM | POA: Diagnosis not present

## 2021-04-11 DIAGNOSIS — I2109 ST elevation (STEMI) myocardial infarction involving other coronary artery of anterior wall: Secondary | ICD-10-CM | POA: Diagnosis not present

## 2021-04-11 DIAGNOSIS — E119 Type 2 diabetes mellitus without complications: Secondary | ICD-10-CM | POA: Insufficient documentation

## 2021-04-11 DIAGNOSIS — I119 Hypertensive heart disease without heart failure: Secondary | ICD-10-CM | POA: Insufficient documentation

## 2021-04-11 DIAGNOSIS — E785 Hyperlipidemia, unspecified: Secondary | ICD-10-CM | POA: Diagnosis present

## 2021-04-11 DIAGNOSIS — I214 Non-ST elevation (NSTEMI) myocardial infarction: Secondary | ICD-10-CM | POA: Insufficient documentation

## 2021-04-11 DIAGNOSIS — I213 ST elevation (STEMI) myocardial infarction of unspecified site: Secondary | ICD-10-CM

## 2021-04-11 DIAGNOSIS — R001 Bradycardia, unspecified: Secondary | ICD-10-CM | POA: Diagnosis not present

## 2021-04-11 DIAGNOSIS — Z20822 Contact with and (suspected) exposure to covid-19: Secondary | ICD-10-CM | POA: Diagnosis not present

## 2021-04-11 DIAGNOSIS — Z7982 Long term (current) use of aspirin: Secondary | ICD-10-CM | POA: Insufficient documentation

## 2021-04-11 DIAGNOSIS — I10 Essential (primary) hypertension: Secondary | ICD-10-CM | POA: Diagnosis present

## 2021-04-11 DIAGNOSIS — R0789 Other chest pain: Secondary | ICD-10-CM | POA: Diagnosis not present

## 2021-04-11 DIAGNOSIS — Z79899 Other long term (current) drug therapy: Secondary | ICD-10-CM | POA: Insufficient documentation

## 2021-04-11 DIAGNOSIS — I4891 Unspecified atrial fibrillation: Secondary | ICD-10-CM | POA: Diagnosis not present

## 2021-04-11 DIAGNOSIS — I959 Hypotension, unspecified: Secondary | ICD-10-CM | POA: Diagnosis not present

## 2021-04-11 DIAGNOSIS — I462 Cardiac arrest due to underlying cardiac condition: Secondary | ICD-10-CM | POA: Diagnosis present

## 2021-04-11 HISTORY — DX: Type 2 diabetes mellitus without complications: E11.9

## 2021-04-11 HISTORY — PX: CORONARY/GRAFT ACUTE MI REVASCULARIZATION: CATH118305

## 2021-04-11 HISTORY — PX: LEFT HEART CATH AND CORONARY ANGIOGRAPHY: CATH118249

## 2021-04-11 LAB — CBC WITH DIFFERENTIAL/PLATELET
Abs Immature Granulocytes: 0.18 10*3/uL — ABNORMAL HIGH (ref 0.00–0.07)
Basophils Absolute: 0 10*3/uL (ref 0.0–0.1)
Basophils Relative: 0 %
Eosinophils Absolute: 0.3 10*3/uL (ref 0.0–0.5)
Eosinophils Relative: 3 %
HCT: 39.5 % (ref 39.0–52.0)
Hemoglobin: 12.8 g/dL — ABNORMAL LOW (ref 13.0–17.0)
Immature Granulocytes: 2 %
Lymphocytes Relative: 12 %
Lymphs Abs: 1.4 10*3/uL (ref 0.7–4.0)
MCH: 29.2 pg (ref 26.0–34.0)
MCHC: 32.4 g/dL (ref 30.0–36.0)
MCV: 90 fL (ref 80.0–100.0)
Monocytes Absolute: 1 10*3/uL (ref 0.1–1.0)
Monocytes Relative: 9 %
Neutro Abs: 8.5 10*3/uL — ABNORMAL HIGH (ref 1.7–7.7)
Neutrophils Relative %: 74 %
Platelets: 164 10*3/uL (ref 150–400)
RBC: 4.39 MIL/uL (ref 4.22–5.81)
RDW: 14.3 % (ref 11.5–15.5)
WBC: 11.5 10*3/uL — ABNORMAL HIGH (ref 4.0–10.5)
nRBC: 0 % (ref 0.0–0.2)

## 2021-04-11 LAB — COMPREHENSIVE METABOLIC PANEL
ALT: 20 U/L (ref 0–44)
AST: 22 U/L (ref 15–41)
Albumin: 3.2 g/dL — ABNORMAL LOW (ref 3.5–5.0)
Alkaline Phosphatase: 48 U/L (ref 38–126)
Anion gap: 12 (ref 5–15)
BUN: 25 mg/dL — ABNORMAL HIGH (ref 8–23)
CO2: 25 mmol/L (ref 22–32)
Calcium: 8.8 mg/dL — ABNORMAL LOW (ref 8.9–10.3)
Chloride: 105 mmol/L (ref 98–111)
Creatinine, Ser: 1.21 mg/dL (ref 0.61–1.24)
GFR, Estimated: 58 mL/min — ABNORMAL LOW (ref >60)
Glucose, Bld: 194 mg/dL — ABNORMAL HIGH (ref 70–99)
Potassium: 2.9 mmol/L — ABNORMAL LOW (ref 3.5–5.1)
Sodium: 142 mmol/L (ref 135–145)
Total Bilirubin: 1.1 mg/dL (ref 0.3–1.2)
Total Protein: 6 g/dL — ABNORMAL LOW (ref 6.5–8.1)

## 2021-04-11 LAB — RESP PANEL BY RT-PCR (FLU A&B, COVID) ARPGX2
Influenza A by PCR: NEGATIVE
Influenza B by PCR: NEGATIVE
SARS Coronavirus 2 by RT PCR: NEGATIVE

## 2021-04-11 LAB — I-STAT CHEM 8, ED
BUN: 24 mg/dL — ABNORMAL HIGH (ref 8–23)
Calcium, Ion: 1.15 mmol/L (ref 1.15–1.40)
Chloride: 105 mmol/L (ref 98–111)
Creatinine, Ser: 1.2 mg/dL (ref 0.61–1.24)
Glucose, Bld: 184 mg/dL — ABNORMAL HIGH (ref 70–99)
HCT: 38 % — ABNORMAL LOW (ref 39.0–52.0)
Hemoglobin: 12.9 g/dL — ABNORMAL LOW (ref 13.0–17.0)
Potassium: 2.9 mmol/L — ABNORMAL LOW (ref 3.5–5.1)
Sodium: 142 mmol/L (ref 135–145)
TCO2: 25 mmol/L (ref 22–32)

## 2021-04-11 LAB — HEMOGLOBIN A1C
Hgb A1c MFr Bld: 7.2 % — ABNORMAL HIGH (ref 4.8–5.6)
Mean Plasma Glucose: 159.94 mg/dL

## 2021-04-11 LAB — TROPONIN I (HIGH SENSITIVITY): Troponin I (High Sensitivity): 14 ng/L (ref <18)

## 2021-04-11 LAB — LIPID PANEL
Cholesterol: 115 mg/dL (ref 0–200)
HDL: 33 mg/dL — ABNORMAL LOW (ref >40)
LDL Cholesterol: 63 mg/dL (ref 0–99)
Total CHOL/HDL Ratio: 3.5 RATIO
Triglycerides: 97 mg/dL (ref <150)
VLDL: 19 mg/dL (ref 0–40)

## 2021-04-11 LAB — PROTIME-INR
INR: 1.2 (ref 0.8–1.2)
Prothrombin Time: 15.1 seconds (ref 11.4–15.2)

## 2021-04-11 LAB — APTT: aPTT: 27 seconds (ref 24–36)

## 2021-04-11 SURGERY — LEFT HEART CATH AND CORONARY ANGIOGRAPHY
Anesthesia: LOCAL

## 2021-04-11 MED ORDER — ONDANSETRON HCL 4 MG/2ML IJ SOLN
INTRAMUSCULAR | Status: DC | PRN
  Administered 2021-04-11: 4 mg via INTRAVENOUS

## 2021-04-11 MED ORDER — BIVALIRUDIN BOLUS VIA INFUSION - CUPID: INTRAVENOUS | Status: DC | PRN

## 2021-04-11 MED ORDER — POTASSIUM CHLORIDE 10 MEQ/100ML IV SOLN
INTRAVENOUS | Status: AC | PRN
  Administered 2021-04-11: 10 meq via INTRAVENOUS

## 2021-04-11 MED ORDER — HEPARIN (PORCINE) IN NACL 1000-0.9 UT/500ML-% IV SOLN
INTRAVENOUS | Status: AC
  Filled 2021-04-11: qty 500

## 2021-04-11 MED ORDER — EPINEPHRINE 1 MG/10ML IJ SOSY
PREFILLED_SYRINGE | INTRAMUSCULAR | Status: AC
  Filled 2021-04-11: qty 10

## 2021-04-11 MED ORDER — MAGNESIUM SULFATE 50 % IJ SOLN
INTRAMUSCULAR | Status: AC
  Filled 2021-04-11: qty 2

## 2021-04-11 MED ORDER — LIDOCAINE HCL (PF) 1 % IJ SOLN
INTRAMUSCULAR | Status: DC | PRN
  Administered 2021-04-11: 30 mL via INTRADERMAL

## 2021-04-11 MED ORDER — EPINEPHRINE 1 MG/10ML IJ SOSY
PREFILLED_SYRINGE | INTRAMUSCULAR | Status: DC | PRN
  Administered 2021-04-11: 1 mg via INTRAVENOUS

## 2021-04-11 MED ORDER — NOREPINEPHRINE BITARTRATE 1 MG/ML IV SOLN
INTRAVENOUS | Status: AC | PRN
  Administered 2021-04-11: 10 ug/min via INTRAVENOUS

## 2021-04-11 MED ORDER — NITROGLYCERIN 1 MG/10 ML FOR IR/CATH LAB
INTRA_ARTERIAL | Status: AC
  Filled 2021-04-11: qty 10

## 2021-04-11 MED ORDER — AMIODARONE HCL IN DEXTROSE 360-4.14 MG/200ML-% IV SOLN
60.0000 mg/h | INTRAVENOUS | Status: DC
  Administered 2021-04-11: 60 mg/h via INTRAVENOUS
  Filled 2021-04-11: qty 200

## 2021-04-11 MED ORDER — ASPIRIN 81 MG PO CHEW: 324.0000 mg | CHEWABLE_TABLET | Freq: Once | ORAL | Status: DC

## 2021-04-11 MED ORDER — POTASSIUM CHLORIDE 10 MEQ/100ML IV SOLN
INTRAVENOUS | Status: AC
  Filled 2021-04-11: qty 100

## 2021-04-11 MED ORDER — MAGNESIUM SULFATE 50 % IJ SOLN: INTRAVENOUS | Status: DC | PRN

## 2021-04-11 MED ORDER — AMIODARONE LOAD VIA INFUSION
INTRAVENOUS | Status: DC | PRN
  Administered 2021-04-11: 150 mg via INTRAVENOUS

## 2021-04-11 MED ORDER — NOREPINEPHRINE 4 MG/250ML-% IV SOLN
2.0000 ug/min | INTRAVENOUS | Status: DC
  Administered 2021-04-11: 5 ug/min via INTRAVENOUS
  Filled 2021-04-11: qty 250

## 2021-04-11 MED ORDER — SODIUM CHLORIDE 0.9 % IV SOLN: INTRAVENOUS | Status: DC

## 2021-04-11 MED ORDER — LIDOCAINE-EPINEPHRINE 1 %-1:100000 IJ SOLN
INTRAMUSCULAR | Status: AC
  Filled 2021-04-11: qty 1

## 2021-04-11 MED ORDER — LIDOCAINE HCL (CARDIAC) PF 100 MG/5ML IV SOSY
PREFILLED_SYRINGE | INTRAVENOUS | Status: AC
  Filled 2021-04-11: qty 5

## 2021-04-11 MED ORDER — VERAPAMIL HCL 2.5 MG/ML IV SOLN
INTRAVENOUS | Status: AC
  Filled 2021-04-11: qty 2

## 2021-04-11 MED ORDER — SODIUM CHLORIDE 0.9 % IV SOLN
INTRAVENOUS | Status: AC | PRN
  Administered 2021-04-11: 10 mL/h via INTRAVENOUS

## 2021-04-11 MED ORDER — EPINEPHRINE HCL 5 MG/250ML IV SOLN IN NS
0.5000 ug/min | INTRAVENOUS | Status: DC
  Administered 2021-04-11: 5 ug/min via INTRAVENOUS
  Filled 2021-04-11: qty 250

## 2021-04-11 MED ORDER — AMIODARONE HCL IN DEXTROSE 360-4.14 MG/200ML-% IV SOLN: 30.0000 mg/h | INTRAVENOUS | Status: DC

## 2021-04-11 MED ORDER — MAGNESIUM SULFATE 50 % IJ SOLN
INTRAMUSCULAR | Status: DC | PRN
  Administered 2021-04-11: .5 g via INTRAVENOUS

## 2021-04-11 MED ORDER — HEPARIN BOLUS VIA INFUSION
4000.0000 [IU] | Freq: Once | INTRAVENOUS | Status: AC
  Administered 2021-04-11: 4000 [IU] via INTRAVENOUS
  Filled 2021-04-11: qty 4000

## 2021-04-11 MED ORDER — ONDANSETRON HCL 4 MG/2ML IJ SOLN
INTRAMUSCULAR | Status: AC
  Filled 2021-04-11: qty 2

## 2021-04-11 MED ORDER — LIDOCAINE HCL (CARDIAC) PF 100 MG/5ML IV SOSY
PREFILLED_SYRINGE | INTRAVENOUS | Status: DC | PRN
  Administered 2021-04-11: 100 mg via INTRAVENOUS

## 2021-04-11 MED ORDER — AMIODARONE HCL IN DEXTROSE 360-4.14 MG/200ML-% IV SOLN
INTRAVENOUS | Status: AC
  Filled 2021-04-11: qty 200

## 2021-04-11 MED ORDER — SODIUM CHLORIDE 0.9 % IV SOLN
250.0000 mL | INTRAVENOUS | Status: DC
  Administered 2021-04-11: 250 mL via INTRAVENOUS

## 2021-04-11 MED ORDER — HEPARIN (PORCINE) 25000 UT/250ML-% IV SOLN
1050.0000 [IU]/h | INTRAVENOUS | Status: DC
  Filled 2021-04-11: qty 250

## 2021-04-11 SURGICAL SUPPLY — 13 items
CATH INFINITI 5FR MULTPACK ANG (CATHETERS) ×2 IMPLANT
CATH INFINITI JR4 5F (CATHETERS) ×2 IMPLANT
KIT ENCORE 26 ADVANTAGE (KITS) ×2 IMPLANT
KIT HEART LEFT (KITS) ×3 IMPLANT
PACK CARDIAC CATHETERIZATION (CUSTOM PROCEDURE TRAY) ×3 IMPLANT
SHEATH PINNACLE 6F 10CM (SHEATH) ×2 IMPLANT
SHEATH PINNACLE 8F 10CM (SHEATH) ×2 IMPLANT
SHEATH PROBE COVER 6X72 (BAG) ×2 IMPLANT
TRANSDUCER W/STOPCOCK (MISCELLANEOUS) ×3 IMPLANT
TUBING CIL FLEX 10 FLL-RA (TUBING) ×3 IMPLANT
WIRE EMERALD 3MM-J .035X150CM (WIRE) ×2 IMPLANT
WIRE EMERALD 3MM-J .035X260CM (WIRE) ×2 IMPLANT
WIRE HI TORQ VERSACORE-J 145CM (WIRE) ×2 IMPLANT

## 2021-04-12 MED FILL — Magnesium Sulfate Inj 50%: INTRAMUSCULAR | Qty: 2 | Status: AC

## 2021-04-12 MED FILL — Epinephrine Soln Prefilled Syringe 1 MG/10ML (0.1 MG/ML): INTRAMUSCULAR | Qty: 10 | Status: AC

## 2021-04-12 MED FILL — Heparin Sod (Porcine)-NaCl IV Soln 1000 Unit/500ML-0.9%: INTRAVENOUS | Qty: 1000 | Status: AC

## 2021-04-12 MED FILL — Verapamil HCl IV Soln 2.5 MG/ML: INTRAVENOUS | Qty: 2 | Status: AC

## 2021-04-12 MED FILL — Nitroglycerin IV Soln 100 MCG/ML in D5W: INTRA_ARTERIAL | Qty: 10 | Status: AC

## 2021-04-16 ENCOUNTER — Telehealth: Payer: Self-pay | Admitting: Cardiovascular Disease

## 2021-04-16 NOTE — Telephone Encounter (Signed)
Routed to medical records for assistance with form Dr. Gwenlyn Found is in the office tomorrow AM

## 2021-04-16 NOTE — Telephone Encounter (Signed)
Heather with [employer] is requesting to have Dr. Amie Critchley the patient's death certificate for patient's cremation.  Phone #: [phone]

## 2021-04-16 NOTE — Telephone Encounter (Signed)
Heather from [employer] is following up on death certificate. She advised that the patient is being cremated on Thursday [phone]

## 2021-04-17 ENCOUNTER — Telehealth: Payer: Self-pay | Admitting: Cardiovascular Disease

## 2021-04-17 NOTE — Telephone Encounter (Signed)
Lenor Derrick calling to follow up on death certificate. He states this needs to be done today.

## 2021-04-17 NOTE — Telephone Encounter (Signed)
Elisa, Jeannot - 04/16/2021 10:19 AM Lorretta Harp, MD  Sent: Wed April 17, 2021  4:06 PM  To: Nuala Alpha, LPN          Message  I completed that today

## 2021-04-17 NOTE — Telephone Encounter (Signed)
  Hi Good Morning, I have [employer] on the line following up on the death certificate.. they say this is very urgent as family has planned for a service tomorrow where they will be putting the ashes in an urn.. please advise

## 2021-04-17 NOTE — Telephone Encounter (Signed)
Will route this message to Dr. Gwenlyn Found and his covering nurse this morning, for further assistance with this.  Will also CC med rec in this as well.

## 2021-04-19 ENCOUNTER — Encounter: Payer: Self-pay | Admitting: Internal Medicine

## 2021-05-11 NOTE — Death Summary Note (Signed)
Death Summary    Patient ID: Erik Price MRN: [id number]; DOB: 10-Sep-1930  Admit Date: 04/15/2021 Date of Death: Apr 15, 2021  Primary Care Provider: Crist Infante, MD  Primary Cardiologist: None New Primary Electrophysiologist:  None   Discharge Diagnoses    Principal Problem:   Acute ST elevation myocardial infarction (STEMI) of anterolateral wall ([place]) Active Problems:   Cardiogenic shock ([place])   Atrial fibrillation with rapid ventricular response ([place])   Hyperlipidemia with target LDL less than 70   Essential hypertension   Hypokalemia   Diagnostic Studies/Procedures    Cardiac catheterization-see results below. _____________   History of Present Illness     Erik Price is a 87 y.o. male with prior past medical history of hypertension hyperlipidemia and borderline diabetes and was otherwise relatively active.  He presented to [facility] emergency room at roughly 9 AM on the morning of Apr 15, 2021 with complaints of initially 8-9/10 chest pain that was subsequently 6/10 upon arrival.  Code STEMI had been called upon arrival and he was evaluated by EDP and interventional cardiology.  He was started on Levophed and amiodarone for A. fib RVR and cardiogenic shock.  Unfortunately his EKG was concerning for possible left main disease.  His pressures were in the 80s with initiation of Levophed and heart rates were somewhat stabilized on amiodarone.  He was brought emergently to cardiac catheterization lab.  4 days prior to arrival he had been playing basketball and suffered a fall.  Otherwise he was in his usual state of health with no major issues.  Hospital Course   Erik Price was seen and evaluated in the ER as a code STEMI.  Initial EKGs had ST elevations in aVR and V1 with diffuse ST depressions.  Upon arrival to [facility] he actually was having ST elevations in I and aVL with diffuse ST depressions elsewhere.  He developed A. fib RVR and was in cardiac shock with  blood pressures in the high 70s to low 80s upon evaluation.  He was started on combination of Levophed and amiodarone and brought to the cardiac catheterization lab for emergent catheterization.  He was noted to be hypokalemic with a potassium of 2.9.  Angiographic images revealed heavily thrombotic lesion in the left main with thrombus involving the ostial LAD and a significant portion of the proximal LCx with TIMI 0 flow down the LCx and TIMI II flow down the LAD.  During attempted RCA angiography, a root angiogram with the catheter demonstrated a large aortic dissection flap.  This was determined just prior to initiation of Aggrastat.  Stat CVTS consult along with advanced heart failure consult was placed.  Erik Price and Erik Price were on scene emergently.  Unfortunately, during the short period of time it took to determine a potential course of action that would have him go emergently to the OR, he had cardiac arrest with ventricular tachycardia and fibrillation leading to CPR.  Initially ROSC was obtained and the patient has been intubated.  Unfortunately he then went back into PEA arrest.  At this point after discussion with the patient's wife, the decision was made to terminate CPR and the patient died in the Cath Lab on the table.  He was transported over to [facility] for family viewing.  Consultants:  CVTS - Erik Price. CHF / Shock Team - Erik Price  Anesthesiology Code Team  Time of death: 10:39 hr _____________  Labs & Radiologic Studies    CBC Recent  Labs    2021-04-13 0911 04/13/2021 0923  WBC 11.5*  --   NEUTROABS 8.5*  --   HGB 12.8* 12.9*  HCT 39.5 38.0*  MCV 90.0  --   PLT 164  --    Basic Metabolic Panel Recent Labs    13-Apr-2021 0911 2021/04/13 0923  NA 142 142  K 2.9* 2.9*  CL 105 105  CO2 25  --   GLUCOSE 194* 184*  BUN 25* 24*  CREATININE 1.21 1.20  CALCIUM 8.8*  --    Liver Function Tests Recent Labs    04-13-21 0911  AST 22  ALT 20  ALKPHOS 48   BILITOT 1.1  PROT 6.0*  ALBUMIN 3.2*   No results for input(s): LIPASE, AMYLASE in the last 72 hours. High Sensitivity Troponin:   Recent Labs  Lab 04-13-21 0911  TROPONINIHS 14    BNP Invalid input(s): POCBNP D-Dimer No results for input(s): DDIMER in the last 72 hours. Hemoglobin A1C Recent Labs    04-13-2021 0911  HGBA1C 7.2*   Fasting Lipid Panel Recent Labs    04-13-2021 0911  CHOL 115  HDL 33*  LDLCALC 63  TRIG 97  CHOLHDL 3.5   Thyroid Function Tests No results for input(s): TSH, T4TOTAL, T3FREE, THYROIDAB in the last 72 hours.  Invalid input(s): FREET3 _____________  CARDIAC CATHETERIZATION  Result Date: 04/13/21 Images from the original result were not included.   Ost LM to Mid LM lesion is 100% stenosed.   Ost Cx to Prox Cx lesion is 100% stenosed. Erik Price is a 87 y.o. male  [id number] LOCATION:  FACILITY: [facility] PHYSICIAN: Erik Price, M.D. [birth year] DATE OF PROCEDURE:  2021-04-13 DATE OF DISCHARGE: CARDIAC CATHETERIZATION History obtained from chart review.  87 year old frail appearing married Caucasian male without prior cardiac history.  Does have a history of hypertension hyperlipidemia.  He presented with anterolateral STEMI was brought urgently to the Cath Lab for angiography and potential intervention. PROCEDURE DESCRIPTION: The patient was brought to the second floor  Cardiac cath lab in the postabsorptive state. He was not premedicated . His right groin was prepped and shaved in usual sterile fashion. Xylocaine 1% was used for local anesthesia. A 6 French sheath was inserted into the right common femoral artery using standard Seldinger technique.  An 8 French sheath was inserted into the right common femoral vein.  5 French right left Judkins diagnostic catheters were used for selective coronary angiography.  Left ventriculography is not performed.  Isovue dye was used for the entirety of the case.  Retroaortic pressures monitored in the case.    Erik Price had thrombotically occluded left main, circumflex and LAD.  He had a visible aortic dissection with flap.  He ultimately went into VT VF required CPR.  He was on high-dose pressors.  Cardiothoracic surgery was consulted.  Erik Price was in the room as well.  Ultimately, the decision was not to pursue surgical revascularization.  The code was called.  The patient went into PEA and arrested. Erik Price. MD, [facility] 2021/04/13 10:46 AM     Duration of Death Summary Encounter   Greater than 30 minutes including physician time.  Signed, Glenetta Hew, MD 2021/04/13, 2:33 PM

## 2021-05-11 NOTE — Progress Notes (Signed)
   2021-04-13 1016  Clinical Encounter Type  Visited With Family;Health care provider  Visit Type Spiritual support;Social support;Initial  Referral From Nurse  Consult/Referral To Chaplain  Spiritual Encounters  Spiritual Needs Prayer;Emotional;Grief support   Contact person: Pt's wife, Erik Price [phone]  Chaplain responded to page. Estill Bamberg introduced chaplain to Pt's wife, Erik Price and friend, Barnett Applebaum. Chaplain engaged active listening and provided emotional support. Pt's wife appreciated the pastoral presence. Chaplain assisted Pt's wife and friend in going back to Pt's room. Chaplain provided Patient Placement card and educated on next steps. Chaplain assisted Pt's wife and friend out of the hospital.   This note was prepared by Chaplain Resident, Dante Gang, [place]. Chaplain remains available as needed through the on-call pager: [phone].

## 2021-05-11 NOTE — Consult Note (Signed)
ADVANCED HF/SHOCK Team Note  Called to cath lab emergently due to cardiogenic shock.   87 y/o male with HTN, HL, prediabetes brought to cath lab for STEMI and hypotension.  ECG concerning for LM involvement.   Underwent cath which showed heavily thrombotic lesion in the left main with thrombus involving the ostial LAD and a significant portion of the proximal LCx with TIMI 0 flow down the LCx and TIMI II flow down the LAD.  During attempted RCA angiography, a root angiogram with the catheter demonstrated a large aortic dissection flap.   Patient developed progressive hypotension and NE increased to 70.   On my arrival patient mentating well but in distress. Having multiple runs of VT treated with IV amio and mag.   Results of cath discussed with interventional team and TCTS paged. Dr. Kipp Brood arrived in cath lab immediately. Initial plan was to consider taking him to OR for high-risk dissection repair and CABG.   However patient then arrested. Intubated by anesthesia. We gave him multiple round of epi and epi gtt started. Eventually regained ROSC and his wife was brought to see him.   In getting ready to go to OR, patient arrested again and required CPR and escalating doses of Epi.   Again discussed with TCTS and interventional team and it was felt that he was too unstable to get to OR or survive surgery.   He was made DNR and subsequently passed on the cath lab table.   CCT 70 minutes.   Glori Bickers, MD  4:41 PM

## 2021-05-11 NOTE — Progress Notes (Signed)
ANTICOAGULATION CONSULT NOTE - Initial Consult  Pharmacy Consult for heparin Indication: chest pain/ACS  No Known Allergies  Patient Measurements: Height: '5\' 11"'$  (180.3 cm) Weight: 83.9 kg (185 lb) IBW/kg (Calculated) : 75.3 Heparin Dosing Weight: 83 kg   Vital Signs:    Labs: No results for input(s): HGB, HCT, PLT, APTT, LABPROT, INR, HEPARINUNFRC, HEPRLOWMOCWT, CREATININE, CKTOTAL, CKMB, TROPONINIHS in the last 72 hours.  CrCl cannot be calculated (Patient's most recent lab result is older than the maximum 21 days allowed.).   Medical History: Past Medical History:  Diagnosis Date   Arthritis    Bilateral renal cysts    BPH (benign prostatic hyperplasia)    Cancer (HCC)    skin cancer   Deviated nasal septum    Diabetes mellitus without complication (HCC)    Diet controlled, last A1c 5.9%   Diverticulosis    Gout    H/O malaria    as a child   Hyperlipidemia    Hypertension    Kidney stone    Left inguinal hernia    PONV (postoperative nausea and vomiting)    with the TURP, none since    Medications:  (Not in a hospital admission)   Assessment: 32 YOM who presented as a STEMI to start IV heparin. Per patient, he is not on any blood thinners at home. Planning LHC.   Goal of Therapy:  Heparin level 0.3-0.7 units/ml Monitor platelets by anticoagulation protocol: Yes   Plan:  -Heparin 4000 units IV bolus followed by heparin infusion at 1050 units/hr -F/u plans for [facility] s/p LHC   Albertina Parr, PharmD., BCPS, BCCCP Clinical Pharmacist Please refer to [facility] for unit-specific pharmacist

## 2021-05-11 NOTE — ED Notes (Signed)
Spoke with Doug at [facility] to activate a code stemi per Dr. Renne Musca request

## 2021-05-11 NOTE — Transfer of Care (Signed)
Immediate Anesthesia Transfer of Care Note  Patient: Erik Price  Procedure(s) Performed: LEFT HEART CATH AND CORONARY ANGIOGRAPHY Coronary/Graft Acute MI Revascularization  Patient Location: Cath Lab  Anesthesia Type:Patient intubated in cath lab upon respiratory arrest  Level of Consciousness: Patient remains intubated per anesthesia plan  Airway & Oxygen Therapy: Patient remains intubated per anesthesia plan and Patient placed on Ventilator (see vital sign flow sheet for setting)  Post-op Assessment: Post -op Vital signs reviewed and unstable, Anesthesiologist notified  Post vital signs: unstable  Last Vitals:  Vitals Value Taken Time  BP    Temp    Pulse    Resp    SpO2      Last Pain:  Vitals:   2021-04-13 0945  PainSc: 9          Complications: No notable events documented.

## 2021-05-11 NOTE — Anesthesia Procedure Notes (Signed)
Procedure Name: Intubation Date/Time: 04/14/21 10:16 AM Performed by: Moshe Salisbury, CRNA Pre-anesthesia Checklist: Patient identified, Emergency Drugs available, Suction available, Patient being monitored and Timeout performed Patient Re-evaluated:Patient Re-evaluated prior to induction Oxygen Delivery Method: Circle System Utilized Preoxygenation: Pre-oxygenation with 100% oxygen Laryngoscope Size: Glidescope and 4 Tube type: Oral Tube size: 8.0 mm Number of attempts: 1 Airway Equipment and Method: Rigid stylet and Video-laryngoscopy Placement Confirmation: ETT inserted through vocal cords under direct vision and breath sounds checked- equal and bilateral Secured at: 24 cm Tube secured with: Tape Dental Injury: Teeth and Oropharynx as per pre-operative assessment

## 2021-05-11 NOTE — H&P (Signed)
Cardiology Admission History and Physical:   Patient ID: LANDERS SUR MRN: [id number]; DOB: 07-14-1931   Admission date: 05-02-21  PCP:  Crist Infante, MD   [facility] Providers Cardiologist:  None        Chief Complaint: Chest pain-code STEMI  Patient Profile:   Erik Price is a 87 y.o. male with longstanding history of hypertension and hyperlipidemia with borderline diabetes but no prior CAD history who is being seen 05-02-2021 for the evaluation of ST elevation MI and cardiogenic shock.  History of Present Illness:   Erik Price was in his usual state of health, no longer as active as he had been because of musculoskeletal issues, but is still walking around the retirement community without difficulty, and still enjoyed shooting "hoops "(as recent as 2 days ago).  He did have a fall-mechanical recently but otherwise no major issues.   He woke this morning about 8:00 (02-May-2021) with severe 8-9/10 chest pain that subsequently improved down about 6 /10 but never got better.  EMS was contacted, initial EKG by ED EMS showed ST elevations in aVL and V2 with diffuse depressions in every other lead with exception of aVF and V2.  He was somewhat hemodynamically stable until arrival in the emergency room while in the emergency room he was found to be in A. fib RVR rates in the up to the 140s with blood pressures dropping into the 70s and 80s.  Despite this he was mentating well.  Still having 6 /10 chest pain. Upon my arrival, he was being started on Levophed and amiodarone based on my recommendations via telephone.  I explained to him that it appears that he is having myocardial infarction and recommended cardiac catheterization.  We discussed the risks and benefits and alternatives/indications.    Risks / Complications include, but not limited to: Death, MI, CVA/TIA, VF/VT (with defibrillation), Bradycardia (need for temporary pacer placement), contrast induced nephropathy, bleeding /  bruising / hematoma / pseudoaneurysm, vascular or coronary injury (with possible emergent CT or Vascular Surgery), adverse medication reactions, infection.  Additional risks involving the use of radiation with the possibility of radiation burns and cancer were explained in detail.  The patient (and family) voice understanding and agree to proceed.    Given his advanced age I asked if he would be interested, and he indicated that he was in agreement to proceed to the cardiac catheterization lab  I-STAT labs while leaving the ER showed potassium of 2.9.  Plans are to initiate repletion in the Cath Lab.  Past Medical History:  Diagnosis Date   Arthritis    Bilateral renal cysts    BPH (benign prostatic hyperplasia)    Cancer (HCC)    skin cancer   Deviated nasal septum    Diabetes mellitus without complication, without long-term current use of insulin (HCC)    Diet controlled, last A1c 5.9%   Diverticulosis    Gout    H/O malaria    as a child   Hyperlipidemia    Hypertension    Kidney stone    Left inguinal hernia    PONV (postoperative nausea and vomiting)    with the TURP, none since    Past Surgical History:  Procedure Laterality Date   APPENDECTOMY     CARPAL TUNNEL RELEASE Right    COLONOSCOPY     ELBOW SURGERY Right    EYE SURGERY Bilateral    cataract surgery with lens implant   HERNIA REPAIR  inguinal   LUMBAR LAMINECTOMY/DECOMPRESSION MICRODISCECTOMY Left 03/22/2015   Procedure: Microdiscectomy - Lumbar three,Lumbar four left;  Surgeon: Eustace Moore, MD;  Location: [facility];  Service: Neurosurgery;  Laterality: Left;  left   MOHS SURGERY Right    ear   RETINAL TEAR REPAIR CRYOTHERAPY  2006   TONSILLECTOMY     and adenoids   TRANSURETHRAL RESECTION OF PROSTATE  1988     Medications Prior to Admission: Prior to Admission medications   Medication Sig Start Date End Date Taking? Authorizing Provider  acetaminophen (TYLENOL) 500 MG tablet Take 1,000 mg by  mouth every 8 (eight) hours as needed (pain).    [provider]  allopurinol (ZYLOPRIM) 300 MG tablet Take 150 mg by mouth at bedtime.    [provider]  amLODipine (NORVASC) 10 MG tablet Take 10 mg by mouth every morning.    [provider]  aspirin 81 MG tablet Take 81 mg by mouth daily.    [provider]  ciprofloxacin (CIPRO) 500 MG tablet Take 1 tablet (500 mg total) by mouth 2 (two) times daily. Patient not taking: Reported on 02/23/2015 11/13/14   Rama, Venetia Maxon, MD  docusate sodium (COLACE) 100 MG capsule Take 4 capsules (400 mg total) by mouth daily. 11/13/14   Rama, Venetia Maxon, MD  fexofenadine (ALLEGRA) 180 MG tablet Take 90 mg by mouth every morning.    [provider]  fluticasone (FLONASE) 50 MCG/ACT nasal spray Place 2 sprays into both nostrils at bedtime.    [provider]  hydrochlorothiazide (HYDRODIURIL) 25 MG tablet Take 25 mg by mouth every morning.    [provider]  lisinopril (PRINIVIL,ZESTRIL) 20 MG tablet Take 20 mg by mouth every morning.    [provider]  Methylcellulose, Laxative, 500 MG TABS Take 500 mg by mouth daily at 12 noon.     [provider]  metoprolol (LOPRESSOR) 50 MG tablet Take 50 mg by mouth 2 (two) times daily.    [provider]  Multiple Vitamin (MULTIVITAMIN WITH MINERALS) TABS tablet Take 1 tablet by mouth every morning.    [provider]  Omega-3 Fatty Acids (FISH OIL) 1000 MG CAPS Take 1,000 mg by mouth 3 (three) times daily.    [provider]  potassium chloride SA (K-DUR,KLOR-CON) 20 MEQ tablet Take 20 mEq by mouth as directed. Alternating BID and TID    [provider]  predniSONE (DELTASONE) 20 MG tablet TAKE 1 TABLET 3 TIMES A DAY FOR 5 DAYS 03/09/15   [provider]  simvastatin (ZOCOR) 40 MG tablet Take 20 mg by mouth at bedtime.    [provider]  traMADol (ULTRAM) 50 MG tablet Take 50 mg by mouth  every 6 (six) hours as needed for moderate pain.    [provider]  VIAGRA 100 MG tablet Take 100 mg by mouth as needed for erectile dysfunction.  09/29/14   [provider]     Allergies:   No Known Allergies  Social History:   Social History   Socioeconomic History   Marital status: Married    Spouse name: Not on file   Number of children: 2   Years of education: Not on file   Highest education level: Not on file  Occupational History   Occupation: Social research officer, government  Tobacco Use   Smoking status: Never   Smokeless tobacco: Never  Substance and Sexual Activity   Alcohol use: Yes    Alcohol/week: 2.0  standard drinks    Types: 2 Glasses of wine per week    Comment: 1 beer or a glass of week per week   Drug use: No   Sexual activity: Not on file  Other Topics Concern   Not on file  Social History Narrative   Married.  Lives with his wife in a retirement community.  Ambulates without assistance.   Social Determinants of Health   Financial Resource Strain: Not on file  Food Insecurity: Not on file  Transportation Needs: Not on file  Physical Activity: Not on file  Stress: Not on file  Social Connections: Not on file  Intimate Partner Violence: Not on file    Family History: Relatively noncontributory. The patient's family history includes Heart attack in his father; Rheum arthritis in his mother; Ulcers in his mother.    ROS:  Please see the history of present illness.  Has been doing well up until today.  Just musculoskeletal pains. Denies any significant dyspnea but does note nausea All other ROS reviewed and negative.     Physical Exam/Data:   Vitals:   04-25-2021 0927 04/25/21 0928 25-Apr-2021 0929 April 25, 2021 0930  BP:      Pulse: 69 76 77 (!) 111  Resp: 17 (!) 21 17 (!) 22  SpO2: 95% 95% 96% 96%  Weight:      Height:       No intake or output data in the 24 hours ending 2021-04-25 0951 Last 3 Weights 2021-04-25 03/22/2015 03/15/2015  Weight (lbs)  185 lb 188 lb 188 lb  Weight (kg) 83.915 kg 85.276 kg 85.276 kg     Body mass index is 25.8 kg/m.  General:  Pleasant elderly gentleman - moderate distress ; diaphoretic - 6/10 CP HEENT: normal - wears glasses; Lymph: no adenopathy Neck: + JVD ~ 8-9  cmH20;  Endocrine:  No thryomegaly Vascular: No carotid bruits; FA pulses 2+ bilaterally without bruits - mildly reduced pulses.  Unfortunately I felt his right but did not try to palpate left radial pulse. Cardiac:  Normal S1 & S2 with Afib RVR. No rubs, soft AS murmr  Lungs: mildy reduced basal breath sounds; no wheezing, rhonchi or rales  Abd: soft, nontender, no hepatomegaly  Ext: no c/c/e Musculoskeletal:  No deformities, BUE and BLE strength normal and equal Skin: warm and dry  Neuro:  CNs 2-12 intact, no focal abnormalities noted- mentating well despite hypotension Psych:  Normal affect    EKG:  The ECGs that was done in EMS & ER was personally reviewed and demonstrates: #1 (0823)~ Afib , rate 54, diffuse ST depression I, II, III, aVF & V3-V6, ~ RBBB /LAFB - CRO ischemia #2 [id number]) Atib - 60 bpm. Stable ST changes #7 (0848) sinus rhythm with PACs versus A. fib.  RBBB.  Now ST elevations in I, aVF along with V5 through V6 with depressions in III, aVF and deep depressions in V1 through V3. #9 (0 852) heart rate 58 bpm and otherwise relatively stable.  Upon my arrival to the ER, was now in A. fib RVR rate from 110-140, blood pressures were in the low 80s prior to initiation of amiodarone and Levophed  Relevant CV Studies: N/a  Laboratory Data:  High Sensitivity Troponin:  No results for input(s): TROPONINIHS in the last 720 hours.    Chemistry Recent Labs  Lab 2021/04/25 0923  NA 142  K 2.9*  CL 105  GLUCOSE 184*  BUN 24*  CREATININE 1.20    No results  for input(s): PROT, ALBUMIN, AST, ALT, ALKPHOS, BILITOT in the last 168 hours. Hematology Recent Labs  Lab 2021/04/17 0923  HGB 12.9*  HCT 38.0*   BNPNo results for  input(s): BNP, PROBNP in the last 168 hours.  DDimer No results for input(s): DDIMER in the last 168 hours.   Radiology/Studies:  No results found.   Assessment and Plan:   Principal Problem:   Acute ST elevation myocardial infarction (STEMI) of anterolateral wall (HCC) Active Problems:   Cardiogenic shock (HCC)   Atrial fibrillation with rapid ventricular response (HCC)   Hyperlipidemia with target LDL less than 70   Essential hypertension   Hypokalemia  Plan - > Emergent Cath (EKG is concerning for left main disease -> may need to consider support) IN ER - started on IV Levophed & IV Amiodarone 2/2 Afib RVR & Hypotension Will recheck Potassium & replete K+ as indicated.    Risk Assessment/Risk Scores:    TIMI Risk Score for ST  Elevation MI:   The patient's TIMI risk score is 12, which indicates a 35.9% risk of all cause mortality at 30 days.     CHA2DS2-VASc Score = 5  This indicates a 7.2% annual risk of stroke. The patient's score is based upon: CHF History: No HTN History: Yes Diabetes History: Yes Stroke History: No Vascular Disease History: Yes Age Score: 2 Gender Score: 0     Severity of Illness: The appropriate patient status for this patient is INPATIENT. Inpatient status is judged to be reasonable and necessary in order to provide the required intensity of service to ensure the patient's safety. The patient's presenting symptoms, physical exam findings, and initial radiographic and laboratory data in the context of their chronic comorbidities is felt to place them at high risk for further clinical deterioration. Furthermore, it is not anticipated that the patient will be medically stable for discharge from the hospital within 2 midnights of admission. The following factors support the patient status of inpatient.   " The patient's presenting symptoms include chest pain, hypotension, tachycardia. " The worrisome physical exam findings include mild  diaphoresis, hypotension, tachcardia. " The initial radiographic and laboratory data are worrisome because of EKG initially with STE in V1 & AvR with diffuse ST depression - follwed by more lateral STE. " The chronic co-morbidities include HTn, HLD, borderline DM-2.   * I certify that at the point of admission it is my clinical judgment that the patient will require inpatient hospital care spanning beyond 2 midnights from the point of admission due to high intensity of service, high risk for further deterioration and high frequency of surveillance required.*   For questions or updates, please contact [facility] Please consult [email or web address] for contact info under     Signed, Glenetta Hew, MD  04/17/21  [phone]   ADDENDUM Patient was taken emergently to the cardiac catheterization lab.  His pressures were still low.  Dr. Gwenlyn Found placed central line.  Initial angiography revealed heavily thrombotic lesion in the left main into both LAD and LCx with TIMI 0 flow down the LCx.  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  Initial plans for Aggrastat were discontinued prior to it being initiated.  Advanced Heart Failure/Shock team consult with Dr. Haroldine Laws, and CVTS consultation obtained with initial plans to consider emergent surgical repair.  Unfortunately, the patient continued to remain unstable having several runs of  ventricular tachycardia eventually sustained with loss of pulse.  CPR was initiated and intubation completed.  Several rounds of CPR performed and he did receive IV epinephrine x2 with initiation of epinephrine drip.  Potassium was also supplemented along with magnesium.  After initiating another round of CPR for recurrent V. tach and loss of pulse, we had a discussion with the patient's wife.  At this point we decided that he would most likely not survive going  to the OR.  At this point we decided to attempt to restore perfusing pressure and allow him to be moved to the CCU with some supportive care until he was able to be terminally extubated. No further escalation of care.   Glenetta Hew, MD

## 2021-05-11 NOTE — ED Triage Notes (Addendum)
Pt arrives via [facility] with reports of left sided chest pain and nausea. Pt also reports falling 4 days ago onto his back. Pt received 324 ASA and '4mg'$  zofran en route.

## 2021-05-11 NOTE — Progress Notes (Signed)
RT arrived to cath lab room and patient was intubated and being ventilated via BVM. RT connected patient to the ventilator until time of death was called.

## 2021-05-11 NOTE — ED Provider Notes (Signed)
[facility] EMERGENCY DEPARTMENT Provider Note   CSN: [id number] Arrival date & time: 2021-05-06  0903     History Chief Complaint  Patient presents with   Chest Pain    EDWORD PRIDGEON is a 87 y.o. male.  HPI Patient denies any history of coronary artery disease that he is aware of.  He awakened this morning and was getting started in his day.  At 8 AM he had a sudden onset of severe left upper chest pain.  Associated with that was nausea, generalized weakness and sweating.  Patient denies any history of prior symptoms.  He did take aspirin at home.  EMS was called.  EMS arrival patient was very diaphoretic with soft blood pressures in the 0000000 systolic.  Patient's EKG is concerning for ischemic changes.  Reviewed by myself by EMS transmission positive for ST elevation MI.  Patient reports he is compliant with antihypertensive medications.  He has been feeling well.  He reports 2 days ago he was shooting a basketball and fell backwards.  He landed on his back so he has had a sore back.  He reports he minimally struck his head but did not have any loss of consciousness or headache or confusion subsequent to that.  No history of bleeding.  Patient is not anticoagulated.    Past Medical History:  Diagnosis Date   Arthritis    Bilateral renal cysts    BPH (benign prostatic hyperplasia)    Cancer (HCC)    skin cancer   Deviated nasal septum    Diabetes mellitus without complication (HCC)    Diet controlled, last A1c 5.9%   Diverticulosis    Gout    H/O malaria    as a child   Hyperlipidemia    Hypertension    Kidney stone    Left inguinal hernia    PONV (postoperative nausea and vomiting)    with the TURP, none since    Patient Active Problem List   Diagnosis Date Noted   S/P lumbar laminectomy 03/22/2015   Other hydronephrosis    Essential hypertension    Renal cyst    Hydronephrosis, right 11/11/2014   Bilateral renal cysts 11/11/2014   Abdominal pain  11/11/2014   Hypertension    Gout    Diabetes mellitus without complication ([place])     Past Surgical History:  Procedure Laterality Date   APPENDECTOMY     CARPAL TUNNEL RELEASE Right    COLONOSCOPY     ELBOW SURGERY Right    EYE SURGERY Bilateral    cataract surgery with lens implant   HERNIA REPAIR     inguinal   LUMBAR LAMINECTOMY/DECOMPRESSION MICRODISCECTOMY Left 03/22/2015   Procedure: Microdiscectomy - Lumbar three,Lumbar four left;  Surgeon: Eustace Moore, MD;  Location: [facility];  Service: Neurosurgery;  Laterality: Left;  left   MOHS SURGERY Right    ear   RETINAL TEAR REPAIR CRYOTHERAPY  2006   TONSILLECTOMY     and adenoids   TRANSURETHRAL RESECTION OF PROSTATE  1988       Family History  Problem Relation Age of Onset   Heart attack Father        Died at age 31   Ulcers Mother    Rheum arthritis Mother     Social History   Tobacco Use   Smoking status: Never   Smokeless tobacco: Never  Substance Use Topics   Alcohol use: Yes    Alcohol/week: 2.0 standard drinks  Types: 2 Glasses of wine per week    Comment: 1 beer or a glass of week per week   Drug use: No    Home Medications Prior to Admission medications   Medication Sig Start Date End Date Taking? Authorizing Provider  acetaminophen (TYLENOL) 500 MG tablet Take 1,000 mg by mouth every 8 (eight) hours as needed (pain).    [provider]  allopurinol (ZYLOPRIM) 300 MG tablet Take 150 mg by mouth at bedtime.    [provider]  amLODipine (NORVASC) 10 MG tablet Take 10 mg by mouth every morning.    [provider]  aspirin 81 MG tablet Take 81 mg by mouth daily.    [provider]  ciprofloxacin (CIPRO) 500 MG tablet Take 1 tablet (500 mg total) by mouth 2 (two) times daily. Patient not taking: Reported on 02/23/2015 11/13/14   Rama, Venetia Maxon, MD  docusate sodium (COLACE) 100 MG capsule Take 4 capsules (400 mg total) by mouth daily. 11/13/14   Rama, Venetia Maxon, MD  fexofenadine (ALLEGRA) 180 MG tablet Take 90 mg by mouth every morning.    [provider]  fluticasone (FLONASE) 50 MCG/ACT nasal spray Place 2 sprays into both nostrils at bedtime.    [provider]  hydrochlorothiazide (HYDRODIURIL) 25 MG tablet Take 25 mg by mouth every morning.    [provider]  lisinopril (PRINIVIL,ZESTRIL) 20 MG tablet Take 20 mg by mouth every morning.    [provider]  Methylcellulose, Laxative, 500 MG TABS Take 500 mg by mouth daily at 12 noon.     [provider]  metoprolol (LOPRESSOR) 50 MG tablet Take 50 mg by mouth 2 (two) times daily.    [provider]  Multiple Vitamin (MULTIVITAMIN WITH MINERALS) TABS tablet Take 1 tablet by mouth every morning.    [provider]  Omega-3 Fatty Acids (FISH OIL) 1000 MG CAPS Take 1,000 mg by mouth 3 (three) times daily.    [provider]  potassium chloride SA (K-DUR,KLOR-CON) 20 MEQ tablet Take 20 mEq by mouth as directed. Alternating BID and TID    [provider]  predniSONE (DELTASONE) 20 MG tablet TAKE 1 TABLET 3 TIMES A DAY FOR 5 DAYS 03/09/15   [provider]  simvastatin (ZOCOR) 40 MG tablet Take 20 mg by mouth at bedtime.    [provider]  traMADol (ULTRAM) 50 MG tablet Take 50 mg by mouth every 6 (six) hours as needed for moderate pain.    [provider]  VIAGRA 100 MG tablet Take 100 mg by mouth as needed for erectile dysfunction.  09/29/14   [provider]    Allergies    Patient has no known allergies.  Review of Systems   Review of Systems All 5 caveat cannot obtain review of systems due to patient condition. Physical Exam Updated Vital Signs Ht '5\' 11"'$  (1.803 m)   Wt 83.9 kg   BMI 25.80 kg/m   Physical Exam Constitutional:      Comments: Alert with clear mental status.  No respiratory distress at rest.  Patient is pale and diaphoretic.  He is very nauseated.  Unwell in  appearance.  HENT:     Mouth/Throat:     Pharynx: Oropharynx is clear.  Eyes:     Extraocular Movements: Extraocular movements intact.  Cardiovascular:     Comments: Heart rate was regular in the 60s on arrival with distant heart sounds.  Subsequently heart rate  has increased and is a regular tachycardic ranging 1 teens to 140s. Pulmonary:     Comments: No respiratory distress at rest.  Lungs are grossly clear. Abdominal:     General: There is no distension.     Palpations: Abdomen is soft.     Tenderness: There is no abdominal tenderness. There is no guarding.  Musculoskeletal:        General: No swelling or tenderness. Normal range of motion.  Skin:    Coloration: Skin is pale.     Comments: Patient skin is pale, diaphoretic  Neurological:     General: No focal deficit present.     Mental Status: He is oriented to person, place, and time.     Coordination: Coordination normal.    ED Results / Procedures / Treatments   Labs (all labs ordered are listed, but only abnormal results are displayed) Labs Reviewed  RESP PANEL BY RT-PCR (FLU A&B, COVID) ARPGX2  HEMOGLOBIN A1C  CBC WITH DIFFERENTIAL/PLATELET  PROTIME-INR  APTT  COMPREHENSIVE METABOLIC PANEL  LIPID PANEL  TROPONIN I (HIGH SENSITIVITY)    EKG EKG Interpretation  Date/Time:  05/10/2021 EDT Ventricular Rate:  136 PR Interval:    QRS Duration: 149 QT Interval:  357 QTC Calculation: 537 R Axis:   140 Text Interpretation: Atrial fibrillation Right bundle branch block Lateral infarct, acute Artifact in lead(s) I III aVL Acute MI agree.  new since previous tracings Confirmed by Charlesetta Shanks [phone]) on 2021-05-10 9:25:27 AM  Radiology No results found.  Procedures Procedures  CRITICAL CARE Performed by: Charlesetta Shanks   Total critical care time: 30 minutes  Critical care time was exclusive of separately billable procedures and treating other patients.  Critical care was  necessary to treat or prevent imminent or life-threatening deterioration.  Critical care was time spent personally by me on the following activities: development of treatment plan with patient and/or surrogate as well as nursing, discussions with consultants, evaluation of patient's response to treatment, examination of patient, obtaining history from patient or surrogate, ordering and performing treatments and interventions, ordering and review of laboratory studies, ordering and review of radiographic studies, pulse oximetry and re-evaluation of patient's condition.  Medications Ordered in ED Medications  heparin ADULT infusion 100 units/mL (25000 units/263m) (1,050 Units/hr Intravenous New Bag/Given 909-30-220914)  0.9 %  sodium chloride infusion ( Intravenous New Bag/Given 909/30/20220918)  0.9 %  sodium chloride infusion (has no administration in time range)  norepinephrine (LEVOPHED) '4mg'$  in 2524mpremix infusion (has no administration in time range)  amiodarone (NEXTERONE PREMIX) 360-4.14 MG/200ML-% (1.8 mg/mL) IV infusion (has no administration in time range)  amiodarone (NEXTERONE PREMIX) 360-4.14 MG/200ML-% (1.8 mg/mL) IV infusion (has no administration in time range)  heparin bolus via infusion 4,000 Units (4,000 Units Intravenous Bolus from Bag 9/Sep 30, 2022917)    ED Course  I have reviewed the triage vital signs and the nursing notes.  Pertinent labs & imaging results that were available during my care of the patient were reviewed by me and considered in my medical decision making (see chart for details).    MDM Rules/Calculators/A&P                           EKG is transmitted by EMS consistent with STEMI.  Reviewed and code STEMI initiated.  On arrival, patient is alert with stable respiratory status but pale, diaphoretic and very nauseated.  Patient's blood pressure soft systolic  90s.  Heparin initiated.  Patient had been given aspirin prior to arrival.  Patient has been given Zofran  for nausea.  Rates elevated from rates in the 60s to rapid A. fib 1 teens to 0000000 with soft systolic blood pressure dropping to 85.  Case reviewed with Dr. Ellyn Hack.  At that time decision made to initiate Levophed and amiodarone.  Patient's airway and mental status remain stable.  We will continue to closely monitor awaiting transition to Cath Lab. Final Clinical Impression(s) / ED Diagnoses Final diagnoses:  Acute ST elevation myocardial infarction (STEMI), unspecified artery [facility])    Rx / DC Orders ED Discharge Orders     None        Charlesetta Shanks, MD 04-21-21 [phone]

## 2021-05-11 DEATH — deceased
